# Patient Record
Sex: Male | Born: 1960
Health system: Southern US, Community
[De-identification: ages and names within clinical notes are randomized; demographics above are authoritative.]

## PROBLEM LIST (undated history)

## (undated) DIAGNOSIS — K5792 Diverticulitis of intestine, part unspecified, without perforation or abscess without bleeding: Secondary | ICD-10-CM

## (undated) DIAGNOSIS — T8859XA Other complications of anesthesia, initial encounter: Secondary | ICD-10-CM

## (undated) DIAGNOSIS — R0600 Dyspnea, unspecified: Secondary | ICD-10-CM

## (undated) DIAGNOSIS — F319 Bipolar disorder, unspecified: Secondary | ICD-10-CM

## (undated) DIAGNOSIS — F329 Major depressive disorder, single episode, unspecified: Secondary | ICD-10-CM

## (undated) DIAGNOSIS — I10 Essential (primary) hypertension: Secondary | ICD-10-CM

## (undated) DIAGNOSIS — T4145XA Adverse effect of unspecified anesthetic, initial encounter: Secondary | ICD-10-CM

## (undated) DIAGNOSIS — F419 Anxiety disorder, unspecified: Secondary | ICD-10-CM

## (undated) DIAGNOSIS — M1990 Unspecified osteoarthritis, unspecified site: Secondary | ICD-10-CM

## (undated) DIAGNOSIS — F32A Depression, unspecified: Secondary | ICD-10-CM

## (undated) HISTORY — DX: Depression, unspecified: F32.A

## (undated) HISTORY — PX: LARYNGOSCOPY: SUR817

## (undated) HISTORY — PX: TONSILLECTOMY: SUR1361

---
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
## 2017-12-03 ENCOUNTER — Encounter: Payer: Self-pay | Admitting: Family Medicine

## 2017-12-03 ENCOUNTER — Ambulatory Visit (INDEPENDENT_AMBULATORY_CARE_PROVIDER_SITE_OTHER): Payer: Medicaid Other | Admitting: Family Medicine

## 2017-12-03 VITALS — BP 149/92 | HR 77 | Temp 97.5°F | Resp 17 | Ht 71.0 in | Wt 292.6 lb

## 2017-12-03 DIAGNOSIS — K047 Periapical abscess without sinus: Secondary | ICD-10-CM | POA: Diagnosis not present

## 2017-12-03 DIAGNOSIS — F419 Anxiety disorder, unspecified: Secondary | ICD-10-CM | POA: Diagnosis not present

## 2017-12-03 MED ORDER — AMOXICILLIN-POT CLAVULANATE 875-125 MG PO TABS: 1.0000 | ORAL_TABLET | Freq: Two times a day (BID) | ORAL | 0 refills | Status: DC

## 2017-12-03 MED FILL — AMOX-CLAV 875-125 MG TABLET: 875-125 | 10 days supply | Qty: 20 | Fill #0

## 2017-12-03 NOTE — Progress Notes (Addendum)
Nathaniel Grant, is a 57 y.o. male  ZOX:[id number]  [id number]:[id number]  DOB - Nov 09, 1960  CC:  Chief Complaint  Patient presents with  . Establish Care  . Dental Problem    states that he was seen & prescribed the Clindamycin. states that he is having bad reactions to the medication       HPI: Nathaniel Grant is a 57 y.o. male is here today to establish care.   Nathaniel Grant   Today's visit:  Nathaniel Grant recently relocated to [facility] from [facility]. While in [place] he was recently treated with Amoxicillin for an abscess he developed on his gum involving left lower molar. The infection improved although never resolved, he was recently prescribed Clindamycin which he is having difficulty tolerating due to GI side effects. He is requesting another antibiotic as he making efforts to see a dentist within the coming days to have his tooth extracted.  Patient denies new headaches, chest pain, abdominal pain, nausea, new weakness , numbness or tingling, SOB, edema, or worrisome cough.   Current medications: Current Outpatient Medications:  .  clindamycin (CLEOCIN) 300 MG capsule, Take 300 mg by mouth 4 (four) times daily., Disp: , Rfl:  .  methocarbamol (ROBAXIN) 500 MG tablet, Take 500 mg by mouth 2 (two) times daily as needed for muscle spasms., Disp: , Rfl:  .  naproxen (NAPROSYN) 500 MG tablet, Take 500 mg by mouth 2 (two) times daily with a meal., Disp: , Rfl:    Pertinent family medical history: family history includes Atrial fibrillation in his father; Bladder Cancer in his father; Lung cancer in his mother.   Allergies  Allergen Reactions  . Tetanus Toxoids Swelling    Social History   Socioeconomic History  . Marital status: Single    Spouse name: Not on file  . Number of children: Not on file  . Years of education: Not on file  . Highest education level: Not on file  Occupational History  . Not on file  Social Needs  . Financial resource strain: Not on file  . Food insecurity:     Worry: Not on file    Inability: Not on file  . Transportation needs:    Medical: Not on file    Non-medical: Not on file  Tobacco Use  . Smoking status: Never Smoker  . Smokeless tobacco: Never Used  Substance and Sexual Activity  . Alcohol use: Not Currently  . Drug use: Never  . Sexual activity: Not on file  Lifestyle  . Physical activity:    Days per week: Not on file    Minutes per session: Not on file  . Stress: Not on file  Relationships  . Social connections:    Talks on phone: Not on file    Gets together: Not on file    Attends religious service: Not on file    Active member of club or organization: Not on file    Attends meetings of clubs or organizations: Not on file    Relationship status: Not on file  . Intimate partner violence:    Fear of current or ex partner: Not on file    Emotionally abused: Not on file    Physically abused: Not on file    Forced sexual activity: Not on file  Other Topics Concern  . Not on file  Social History Narrative  . Not on file    Review of Systems: Constitutional: Negative for fever, chills, diaphoresis, activity change, appetite change and fatigue.  HENT:postive for tooth pain  Eyes: Negative for pain, discharge, redness, itching and visual disturbance. Respiratory: Negative for cough, choking, chest tightness, shortness of breath, wheezing and stridor.  Cardiovascular: Negative for chest pain, palpitations and leg swelling. Gastrointestinal: Negative for abdominal distention. Genitourinary: Negative for dysuria, urgency, frequency, hematuria, flank pain, decreased urine volume, difficulty urinating. Musculoskeletal: Negative for back pain, joint swelling, arthralgia and gait problem. Neurological: Negative for dizziness, tremors, seizures, syncope, facial asymmetry, speech difficulty, weakness, light-headedness, numbness and headaches.  Hematological: Negative for adenopathy. Does not bruise/bleed  easily. Psychiatric/Behavioral: Hx anxiety which he feels has worsened with panic attacks.  Objective:   Vitals:   12/03/17 1126  BP: (!) 149/92  Pulse: 77  Resp: 17  Temp: (!) 97.5 F (36.4 C)  SpO2: 97%    BP Readings from Last 3 Encounters:  12/03/17 (!) 149/92    Filed Weights   12/03/17 1126  Weight: 292 lb 9.6 oz (132.7 kg)      Physical Exam: Constitutional: Patient appears well-developed and well-nourished. No distress. HENT: Normocephalic, atraumatic, External right and left ear normal. Oropharynx is clear and moist.   Eyes: Conjunctivae and EOM are normal. PERRLA, no scleral icterus. Neck: Normal ROM. Neck supple. Left sided cervical adenopathy noted CVS: RRR, S1/S2 +, no murmurs, no gallops, no carotid bruit.  Pulmonary: Effort and breath sounds normal, no stridor, rhonchi, wheezes, rales.  Abdominal: Soft. BS +, no distension, tenderness, rebound or guarding.  Musculoskeletal: Normal range of motion. No edema and no tenderness.  Neuro: Alert. Normal muscle tone coordination. Normal gait. BUE and BLE strength 5/5.  Skin: Skin is warm and dry. No rash noted. Not diaphoretic. No erythema. No pallor. Psychiatric: Behavior, judgment, thought content normal. Denies suicidal or homicidal ideations.   Assessment and plan:  1. Infection of tooth, discontinue clindamycin. Start Augmentin 1 tablet twice daily x 10 days. Resources given to patient for dental providers accepting patients without insurance.  2. Anxiety No prior work-up for an anxiety disorder . Patient is scheduling a follow-up to address anxiety and is being referred to internal counselor.    Meds ordered this encounter  Medications  . amoxicillin-clavulanate (AUGMENTIN) 875-125 MG tablet    Sig: Take 1 tablet by mouth 2 (two) times daily.    Dispense:  20 tablet    Refill:  0    Return in about 2 weeks (around 12/17/2017) for BP and Anxiety.   A total of 30  minutes spent, greater than 50 % of  this time was spent counseling and coordination of care.   The patient was given clear instructions to go to ER or return to medical center if symptoms don't improve, worsen or new problems develop. The patient verbalized understanding. The patient was advised  to call and obtain lab results if they haven't heard anything from out office within 7-10 business days.  Joaquin Courts, FNP Primary Care at [facility] [address], [place] [address] [phone]: [phone]    This note has been created with Dragon speech recognition software and Paediatric nurse. Any transcriptional errors are unintentional.

## 2017-12-03 NOTE — Patient Instructions (Addendum)
  I have sent medication to: [facility]  [address] [place], [place] [address] [phone]  I will refer you to [facility] our counselor and I will see you back in 2 weeks for address anxiety and recheck you blood pressure.      Dental Abscess A dental abscess is pus in or around a tooth. Follow these instructions at home:  Take medicines only as told by your dentist.  If you were prescribed antibiotic medicine, finish all of it even if you start to feel better.  Rinse your mouth (gargle) often with salt water.  Do not drive or use heavy machinery, like a lawn mower, while taking pain medicine.  Do not apply heat to the outside of your mouth.  Keep all follow-up visits as told by your dentist. This is important. Contact a doctor if:  Your pain is worse, and medicine does not help. Get help right away if:  You have a fever or chills.  Your symptoms suddenly get worse.  You have a very bad headache.  You have problems breathing or swallowing.  You have trouble opening your mouth.  You have puffiness (swelling) in your neck or around your eye. This information is not intended to replace advice given to you by your health care provider. Make sure you discuss any questions you have with your health care provider. Document Released: 06/08/2014 Document Revised: 06/30/2015 Document Reviewed: 01/19/2014 Elsevier Interactive Patient Education  2018 [employer].    Thank you for choosing Primary Care at [facility] for your medical home!    Nathaniel Grant was seen by Joaquin Courts, FNP today.   [employer] primary care is Bing Neighbors, FNP.   For the best care possible,  you should try to see Joaquin Courts, FNP  whenever you come to clinic.   We look forward to seeing you again soon!  If you have any questions about your visit today,  please call us at   Or feel free to reach your provider via MyChart.

## 2017-12-20 ENCOUNTER — Ambulatory Visit (INDEPENDENT_AMBULATORY_CARE_PROVIDER_SITE_OTHER): Payer: Medicaid Other | Admitting: Family Medicine

## 2017-12-20 ENCOUNTER — Encounter: Payer: Self-pay | Admitting: Family Medicine

## 2017-12-20 ENCOUNTER — Ambulatory Visit: Payer: Self-pay | Attending: Family Medicine | Admitting: Licensed Clinical Social Worker

## 2017-12-20 VITALS — BP 164/103 | HR 79 | Temp 98.5°F | Resp 17 | Ht 71.0 in

## 2017-12-20 DIAGNOSIS — F331 Major depressive disorder, recurrent, moderate: Secondary | ICD-10-CM | POA: Diagnosis not present

## 2017-12-20 DIAGNOSIS — I10 Essential (primary) hypertension: Secondary | ICD-10-CM

## 2017-12-20 DIAGNOSIS — F411 Generalized anxiety disorder: Secondary | ICD-10-CM

## 2017-12-20 DIAGNOSIS — M5441 Lumbago with sciatica, right side: Secondary | ICD-10-CM

## 2017-12-20 DIAGNOSIS — J3489 Other specified disorders of nose and nasal sinuses: Secondary | ICD-10-CM | POA: Diagnosis not present

## 2017-12-20 DIAGNOSIS — G8929 Other chronic pain: Secondary | ICD-10-CM

## 2017-12-20 DIAGNOSIS — F419 Anxiety disorder, unspecified: Secondary | ICD-10-CM

## 2017-12-20 DIAGNOSIS — M5440 Lumbago with sciatica, unspecified side: Secondary | ICD-10-CM

## 2017-12-20 MED ORDER — PROPRANOLOL HCL 10 MG PO TABS: 10.0000 mg | ORAL_TABLET | Freq: Three times a day (TID) | ORAL | 1 refills | Status: DC

## 2017-12-20 MED ORDER — CYCLOBENZAPRINE HCL 10 MG PO TABS: 10.0000 mg | ORAL_TABLET | Freq: Three times a day (TID) | ORAL | 1 refills | Status: DC | PRN

## 2017-12-20 NOTE — Progress Notes (Signed)
Patient ID: Nathaniel Grant, male    DOB: Aug 18, 1960, 57 y.o.   MRN: [id number]  PCP: Bing NeighborsHarris, Ijeoma Loor S, FNP  Chief Complaint  Patient presents with  . Hypertension  . Anxiety    Subjective:  HPI Nathaniel Grant is a 57 y.o. male presents for evaluation of hypertension and medication management for anxiety/depression.  Patient completed encounter with social worker prior to office today. His mood at present is very liable and he taking in a rapid, pressure manner which is impacting ability to effective evaluate and manage patient visit. He initially has requested during his initial visit to be placed on a medication for anxiety. Today, he is declining any medications to managed mental health conditions. He becomes increasing angry when requests Soma for chronic pain and is told that this is not a medication that is prescribed by this practice. He shifts from subject to subjects at one point interjects that at one point he was treated for Bipolar disorder with Depakote, but the diagnosis was incorrect and he stopped medication independently.    Blood pressure Elevated  Family significant for hypertension parents, siblings from hypertension and her managed with medication.  His blood pressure was significantly elevated x2 readings at initial visit and remains significantly elevated today.  He becomes very belligerent when discussing the possibility of needing blood pressure medications making the comment that "just another pills at me". Blood pressure times two readings are greater than 150/90.  He denies chest pain, headache, although notes head pressure which he relates to chronic sinusitis. Denies dizziness or shortness of breath.   Medication   Social History   Socioeconomic History  . Marital status: Single    Spouse name: Not on file  . Number of children: Not on file  . Years of education: Not on file  . Highest education level: Not on file  Occupational History  . Not on file   Social Needs  . Financial resource strain: Not on file  . Food insecurity:    Worry: Not on file    Inability: Not on file  . Transportation needs:    Medical: Not on file    Non-medical: Not on file  Tobacco Use  . Smoking status: Never Smoker  . Smokeless tobacco: Never Used  Substance and Sexual Activity  . Alcohol use: Not Currently  . Drug use: Never  . Sexual activity: Not on file  Lifestyle  . Physical activity:    Days per week: Not on file    Minutes per session: Not on file  . Stress: Not on file  Relationships  . Social connections:    Talks on phone: Not on file    Gets together: Not on file    Attends religious service: Not on file    Active member of club or organization: Not on file    Attends meetings of clubs or organizations: Not on file    Relationship status: Not on file  . Intimate partner violence:    Fear of current or ex partner: Not on file    Emotionally abused: Not on file    Physically abused: Not on file    Forced sexual activity: Not on file  Other Topics Concern  . Not on file  Social History Narrative  . Not on file    Family History  Problem Relation Age of Onset  . Lung cancer Mother   . Bladder Cancer Father   . Atrial fibrillation Father      Review  of Systems Denies chest pain, headaches, new weakness, worsening cough, abdominal pain, edema, urinary retention, urinary frequency, wheezing,chest tightness,depression, suicidal ideations or auditory hallucinations. Allergies  Allergen Reactions  . Clindamycin/Lincomycin     Bad taste Headache  Gi diarrhea   . Tetanus Toxoids Swelling    Prior to Admission medications   Medication Sig Start Date End Date Taking? Authorizing Provider  methocarbamol (ROBAXIN) 500 MG tablet Take 500 mg by mouth 2 (two) times daily as needed for muscle spasms.   Yes [provider]  naproxen (NAPROSYN) 500 MG tablet Take 500 mg by mouth 2 (two) times daily with a meal.   Yes [provider]    Past Medical, Surgical Family and Social History reviewed and updated.    Objective:   Today's Vitals   12/20/17 1039  BP: (!) 164/103  Pulse: 79  Resp: 17  Temp: 98.5 F (36.9 C)  TempSrc: Oral  SpO2: 95%  Height: 5\' 11"  (1.803 m)    Wt Readings from Last 3 Encounters:  12/03/17 292 lb 9.6 oz (132.7 kg)     Physical Exam General appearance: alert, well developed, well nourished, cooperative and in no distress Head: Normocephalic, without obvious abnormality, atraumatic Respiratory: Respirations even and unlabored, normal respiratory rate Heart: rate and rhythm normal. No gallop or murmurs noted on exam  Extremities: No gross deformities Skin: Skin color, texture, turgor normal. No rashes seen  Psych: Appropriate mood and affect. Neurologic: Mental status: Alert, oriented to person, place, and time, thought content appropriate.  Assessment & Plan:  1. Essential hypertension, new Patient initially refused treatment.  He was advised that if he continues to refuse to manage his accelerated hypertension will no longer be managed by me as his PCP.  We came to the conclusion that he agreed to start propanolol 10 mg which could hopefully help decrease his anxiety as well as manage his blood pressure.  Asymptomatic today.  Given his level of anxiety his blood pressure may be in excess elevation to his current level of temperament.  2. Sinus pressure Recommend trialing saline spray and and as needed cetirizine or any other over-the-counter antihistamine.  3. GAD (generalized anxiety disorder) 4. Moderate episode of recurrent major depressive disorder [facility]) Patient displays symptoms today which are consistent with bipolar disorder.  Given his admitted history and prior diagnoses of bipolar disorder likely warrant a follow-up with a licensed psychologist.  I have referred him back to the social worker after a visit today to receive information regarding resources  within the community where he can continue his current mental health evaluation and management. Started on propanolol 10 mg three times daily PRN for anxiety/ hypertension.  5. Chronic bilateral low back pain with sciatica, sciatica laterality unspecified -Continue Robaxin.  Start cyclobenzaprine 10 mg up to 3 times daily as needed for back pain.  Continue naproxen.  Likely warrant a referral once his financial assistance is completed to orthopedic.  Meds ordered this encounter  Medications  . cyclobenzaprine (FLEXERIL) 10 MG tablet    Sig: Take 1 tablet (10 mg total) by mouth 3 (three) times daily as needed for muscle spasms.    Dispense:  60 tablet    Refill:  1  . propranolol (INDERAL) 10 MG tablet    Sig: Take 1 tablet (10 mg total) by mouth 3 (three) times daily.    Dispense:  90 tablet    Refill:  1      -The patient was given clear instructions  to go to ER or return to medical center if symptoms do not improve, worsen or new problems develop. The patient verbalized understanding.    Joaquin Courts, FNP Primary Care at [facility] [address] [place] [address] [phone]: [phone]

## 2017-12-20 NOTE — BH Specialist Note (Signed)
Integrated Behavioral Health Initial Visit  MRN: [id number] Name: Nathaniel Grant  Number of Integrated Behavioral Health Clinician visits:: 1/6 Session Start time: 9:05 AM  Session End time: 10:05 AM Total time: 1 hour  Type of Service: Integrated Behavioral Health- Individual/Family Interpretor:No. Interpretor Name and Language: N/A   Warm Hand Off Completed.       SUBJECTIVE: Nathaniel Grant is a 57 y.o. male accompanied by Sibling Patient was referred by FNP Tiburcio PeaHarris for depression and anxiety. Patient reports the following symptoms/concerns: [place] to [place] strain and impending homelessness. He is currently residing with his sister. Pt reports difficulty managing ongoing medical conditions, chronic pain, and behavioral health Duration of problem: Ongoing; Severity of problem: moderate  OBJECTIVE: Mood: Anxious, Depressed and Irritable and Affect: Appropriate Risk of harm to self or others: No plan to harm self or others  LIFE CONTEXT: Family and Social: Pt resides with his adult sibling. He has another sister that resides in [place], both parents are deceased. Pt's fiance passed away School/Work: Pt is currently unemployed. Self-Care: Pt has hx of medication management reporting that he used to take wellbutrin and xanax. He cares for his two cats and enjoys being creative (writing and playing music) to cope with stressors Life Changes: Pt reports difficulty managing ongoing medical conditions, chronic pain, and behavioral health.   GOALS ADDRESSED: Patient will: 1. Reduce symptoms of: agitation, anxiety and depression 2. Increase knowledge and/or ability of: coping skills and healthy habits  3. Demonstrate ability to: Increase healthy adjustment to current life circumstances, Increase adequate support systems for patient/family and Increase motivation to adhere to plan of care  INTERVENTIONS: Interventions utilized: Mindfulness or  Management consultantelaxation Training, Supportive Counseling, Psychoeducation and/or Health Education and Link to [facility] Resources  Standardized Assessments completed: GAD-7 and PHQ 2&9  ASSESSMENT: Patient currently experiencing depression and anxiety triggered by difficulty managing ongoing medical conditions, chronic pain, and financial strain. [place] due to [place] strain and impending homelessness. He is currently residing with his sister. Denies any current SI/HI.   Patient may benefit from psychoeducation, psychotherapy, and medication management. LCSWA educated pt on the correlation between one's physical and mental health, in addition, to how stress and grief can negatively impact both. Pt had good insight on triggers that have impacted his ability to cope, including overutilizing medication (xanax) LCSWA validated pt's feelings, provided encouragement, and discussed techniques to implement realistic goals and utilize healthy coping skills. Pt was provided information on crisis intervention, psychotherapy, and medication management resources for the uninsured. Pt agreed to contact [facility] and was provided their contact information.   PLAN: 1. Follow up with behavioral health clinician on : Pt was encouraged to contact LCSWA if symptoms worsen or fail to improve to [facility]. 2. Behavioral recommendations: LCSWA recommends that pt apply healthy coping skills discussed. Pt is encouraged to schedule follow up appointment with LCSWA 3. Referral(s): Integrated Art gallery managerBehavioral Health Services (In Clinic), Community Mental Health Services (LME/Outside Clinic) and Community Resources:  Food and Finances 4. "From scale of 1-10, how likely are you to follow plan?":   Nathaniel LarssonJasmine D Trejan Buda, LCSW 12/24/17 2:53 PM

## 2017-12-25 DIAGNOSIS — F411 Generalized anxiety disorder: Secondary | ICD-10-CM | POA: Insufficient documentation

## 2017-12-25 DIAGNOSIS — F419 Anxiety disorder, unspecified: Secondary | ICD-10-CM | POA: Insufficient documentation

## 2018-01-09 ENCOUNTER — Telehealth: Payer: Self-pay | Admitting: Family Medicine

## 2018-01-09 MED ORDER — NAPROXEN 500 MG PO TABS: 500.0000 mg | ORAL_TABLET | Freq: Two times a day (BID) | ORAL | 1 refills | Status: DC

## 2018-01-09 MED FILL — NAPROXEN 500 MG TABLET: 500 | 45 days supply | Qty: 90 | Fill #0

## 2018-01-09 NOTE — Telephone Encounter (Signed)
Patient sister called requesting  naproxen (NAPROSYN) 500 MG tablet [[id number]][[id number]] for patient, [facility] pharmacy please follow up

## 2018-01-09 NOTE — Telephone Encounter (Signed)
Completed request.  

## 2018-01-09 NOTE — Telephone Encounter (Signed)
Please advise 

## 2018-01-31 ENCOUNTER — Ambulatory Visit (INDEPENDENT_AMBULATORY_CARE_PROVIDER_SITE_OTHER): Payer: Medicaid Other

## 2018-01-31 VITALS — BP 142/93 | HR 70

## 2018-01-31 DIAGNOSIS — I10 Essential (primary) hypertension: Secondary | ICD-10-CM

## 2018-01-31 MED ORDER — PROPRANOLOL HCL 20 MG PO TABS: 20.0000 mg | ORAL_TABLET | Freq: Three times a day (TID) | ORAL | 1 refills | Status: DC

## 2018-01-31 NOTE — Progress Notes (Signed)
Reviewed CMA documentation in accordance with the prescribed plan

## 2018-01-31 NOTE — Progress Notes (Signed)
Patient here for BP check. BP was 142/93. Pulse was 70. Notified provider. She states to have patient increase Propranolol to 20 mg TID. Made nurse visit for 03/14/17 for BP check. Otis PeakKWalker, CMA

## 2018-03-14 ENCOUNTER — Ambulatory Visit: Payer: Self-pay

## 2018-04-02 ENCOUNTER — Ambulatory Visit: Payer: Medicaid Other

## 2018-04-02 VITALS — BP 142/93 | HR 85

## 2018-04-02 DIAGNOSIS — I10 Essential (primary) hypertension: Secondary | ICD-10-CM

## 2018-04-02 NOTE — Progress Notes (Signed)
Patient here for BP check. Initial BP after sitting for 15 minutes was 168/124. Pulse 76. Provider wanted to let him sit an additional 15 minutes & recheck. Recheck was 142/93. Pulse 85. Spoke with provider & she states to continue current medication and follow up with her in 2 months. Otis Peak, CMA

## 2018-05-27 ENCOUNTER — Telehealth: Payer: Self-pay

## 2018-05-27 NOTE — Telephone Encounter (Signed)
Called patient to do their pre-visit COVID screening.  Have you recently traveled internationally([place], [place], [place], [place], [place]) or within the [place] to a hotspot area([place], [place], [place], [place], [place])? no  Are you currently experiencing any of the following: fever, cough, SHOB, fatigue? no  Have you been in contact with anyone who has recently travelled? no  Have you been in contact with anyone who is experiencing fever, cough, SHOB, fatigue or been diagnosed with COVID  or works in or has recently visited a SNF? no  

## 2018-05-28 ENCOUNTER — Encounter: Payer: Self-pay | Admitting: Family Medicine

## 2018-05-28 ENCOUNTER — Ambulatory Visit (INDEPENDENT_AMBULATORY_CARE_PROVIDER_SITE_OTHER): Payer: Medicaid Other | Admitting: Family Medicine

## 2018-05-28 ENCOUNTER — Other Ambulatory Visit: Payer: Self-pay

## 2018-05-28 DIAGNOSIS — M5440 Lumbago with sciatica, unspecified side: Secondary | ICD-10-CM

## 2018-05-28 DIAGNOSIS — I10 Essential (primary) hypertension: Secondary | ICD-10-CM

## 2018-05-28 DIAGNOSIS — R600 Localized edema: Secondary | ICD-10-CM | POA: Diagnosis not present

## 2018-05-28 DIAGNOSIS — J392 Other diseases of pharynx: Secondary | ICD-10-CM | POA: Diagnosis not present

## 2018-05-28 DIAGNOSIS — G8929 Other chronic pain: Secondary | ICD-10-CM

## 2018-05-28 MED ORDER — NAPROXEN 500 MG PO TABS: 500.0000 mg | ORAL_TABLET | Freq: Two times a day (BID) | ORAL | 1 refills | Status: AC

## 2018-05-28 MED ORDER — CYCLOBENZAPRINE HCL 10 MG PO TABS: 10.0000 mg | ORAL_TABLET | Freq: Three times a day (TID) | ORAL | 1 refills | Status: DC | PRN

## 2018-05-28 MED ORDER — PROPRANOLOL HCL 20 MG PO TABS: 20.0000 mg | ORAL_TABLET | Freq: Three times a day (TID) | ORAL | 1 refills | Status: AC

## 2018-05-28 NOTE — Progress Notes (Deleted)
Concerned of no medical records from [facility].  Needs to Medical Records  Transferred.

## 2018-06-01 NOTE — Progress Notes (Signed)
Virtual Visit via Telephone Note  I connected with Nathaniel Grant on 06/01/18 at 10:30 AM EDT by telephone and verified that I am speaking with the correct person using two identifiers.   I discussed the limitations, risks, security and privacy concerns of performing an evaluation and management service by telephone and the availability of in person appointments. I also discussed with the patient that there may be a patient responsible charge related to this service. The patient expressed understanding and agreed to proceed.  Provider is located in primary care office during today's encounter.  History of Present Illness: Nathaniel Grant's encounter today is to follow-up regarding hypertension management. Immediately, upon initiation encounter, Nathaniel Grant is verbally agressive and evasive with responding to questions regarding his blood pressure. "I don't know if my blood pressure is good or not". "I don't ever feel fine." "My feet swell daily but resolve later in the day". He endorses sitting in the same spot all day, everyday. Nathaniel Grant also suffers from significant mental health disease and is followed by [facility] for counseling services only as he refuses medication management. He transition the conversation to asking "why don't you have my records". After being told, that our office did not have his records, he again became verbally agitated, and state "how can you treat me". He was again reminded that was only seen previously for dental pain, back pain, and hypertension. During office visit on 12/20/2017, he also demonstrated concerning mental health behaviors and was referred to LCSW, [facility], for further evaluation and management. He then proceeds to tell me that he is transferring his care to [facility], however, requests referrals to orthopedic specialty for chronic back pain and ENT for chronic pharynx narrowing for which he reports receiving treatment by ENT in [facility]. He them refocuses on his  medical records and is demanding that something be done to have these records sent to our office in order for [facility] to be able to access from our [facility] office once he transfers.  Assessment and Plan: 1. Pharyngeal constriction of undetermined etiology - Ambulatory referral to ENT  2. Essential hypertension -No changes to current medication as I am uncertain of BP control -Continue propranolol 20 mg, 3 times daily.  3. Lower extremity edema -Reduce sodium intake -Increase physical activity mobility  -Recommend compression socks   4. Chronic bilateral low back pain with sciatica, sciatica laterality unspecified - Ambulatory referral to Orthopedic Surgery -Continue cyclobenzaprine and naproxen   Follow Up Instructions: -Follow-up with new PCP next month regarding further management of chronic conditions.   I discussed the assessment and treatment plan with the patient. The patient was provided an opportunity to ask questions and all were answered. The patient agreed with the plan and demonstrated an understanding of the instructions.   The patient was advised to call back or seek an in-person evaluation if the symptoms worsen or if the condition fails to improve as anticipated.  I provided 18 minutes of non-face-to-face time during this encounter.   Joaquin Courts, FNP

## 2018-06-09 ENCOUNTER — Telehealth: Payer: Self-pay | Admitting: Family Medicine

## 2018-06-09 NOTE — Telephone Encounter (Signed)
[facility] [phone] Elane Fritz about medical records

## 2018-06-11 ENCOUNTER — Ambulatory Visit (INDEPENDENT_AMBULATORY_CARE_PROVIDER_SITE_OTHER): Payer: Medicaid Other | Admitting: Orthopaedic Surgery

## 2018-06-11 ENCOUNTER — Telehealth: Payer: Self-pay | Admitting: Family Medicine

## 2018-06-11 ENCOUNTER — Other Ambulatory Visit: Payer: Self-pay

## 2018-06-11 ENCOUNTER — Encounter: Payer: Self-pay | Admitting: Orthopaedic Surgery

## 2018-06-11 ENCOUNTER — Ambulatory Visit (INDEPENDENT_AMBULATORY_CARE_PROVIDER_SITE_OTHER): Payer: Medicaid Other

## 2018-06-11 VITALS — Ht 72.0 in | Wt 330.0 lb

## 2018-06-11 DIAGNOSIS — G8929 Other chronic pain: Secondary | ICD-10-CM

## 2018-06-11 DIAGNOSIS — M545 Low back pain: Secondary | ICD-10-CM

## 2018-06-11 DIAGNOSIS — M542 Cervicalgia: Secondary | ICD-10-CM | POA: Diagnosis not present

## 2018-06-11 NOTE — Progress Notes (Signed)
Office Visit Note   Patient: Nathaniel Grant           Date of Birth: 02-Oct-1960           MRN: [id number] Visit Date: 06/11/2018              Requested by: Nathaniel Neighbors, FNP [address] [place], [place] [address] PCP: Nathaniel Neighbors, FNP   Assessment & Plan: Visit Diagnoses:  1. Chronic bilateral low back pain, unspecified whether sciatica present   2. Neck pain   3. Hx of left rotator cuff tear  Plan: We will place patient in physical therapy program for treatment of his back and neck.  Will defer the left rotator cuff tear at this point.  We discussed evaluating the area that seems to bother him the most he can tell us whether he like to proceed with C-spine versus lumbar spine work-up on return if he does not respond to therapy recheck 5 weeks.  Follow-Up Instructions: Return in about 5 weeks (around 07/16/2018).   Orders:  Orders Placed This Encounter  Procedures  . XR Cervical Spine 2 or 3 views  . XR Lumbar Spine 2-3 Views  . Ambulatory referral to Physical Therapy   No orders of the defined types were placed in this encounter.     Procedures: No procedures performed   Clinical Data: No additional findings.   Subjective: Chief Complaint  Patient presents with  . Lower Back - Pain  . Neck - Pain  . Left Shoulder - Pain    HPI 58 year old male with chronic neck pain low back pain.  He previously was treated in [facility] he states at one point they were scheduling him for left rotator cuff repair.  He is left-hand dominant he has problems with numbness in his hands and states he cannot play the guitar for the last 2 years due to numbness in his hands and associated neck pain.  Hands do not wake him up at night he can get his arm up over his head on the left side.  He has taken Naprosyn and Flexeril and states Soma in the past always worked better for him he took it for several years then stopped it on his own.  He had requested more Soma since it  tended to help him.  He is also had problems with chronic back pain and states when he was in his early 85s he was told that he needs surgery on his spine and a fusion was discussed.  He had sciatica on the left side and states he has some problems on the right more on the left.  He states she has some pain in the thoracic spine but neck and low back seem to be worse.  Patient is here with a sister he has history of aggression possible bipolar disorder problems with noncompliance with hypertension medications.  He has been treated by [facility] in the past when he had noncompliant issues with medications.  Today he is conversant cooperative and pleasant and sister is with him.  He denies associated bowel or bladder problems.  Patient remains concerned that all his records from [facility] are not immediately available for all providers here in [place].  Patient apparently had a scan back in August of the shoulder documenting a rotator cuff tear on the left.  Review of Systems positive for obesity lower extremity edema, hypertension with the intermittent noncompliance.  Chronic back pain neck pain, history  of left rotator cuff tear.  Chart review documents a history of verbal aggressive behavior at times.  Positive for anxiety.   Objective: Vital Signs: Ht 6' (1.829 m)   Wt (!) 330 lb (149.7 kg)   BMI 44.76 kg/m   Physical Exam Constitutional:      Appearance: He is well-developed.  HENT:     Head: Normocephalic and atraumatic.  Eyes:     Pupils: Pupils are equal, round, and reactive to light.  Neck:     Thyroid: No thyromegaly.     Trachea: No tracheal deviation.  Cardiovascular:     Rate and Rhythm: Normal rate.  Pulmonary:     Effort: Pulmonary effort is normal.     Breath sounds: No wheezing.  Abdominal:     General: Bowel sounds are normal.     Palpations: Abdomen is soft.  Skin:    General: Skin is warm and dry.     Capillary Refill: Capillary refill takes less than 2 seconds.   Neurological:     Mental Status: He is alert and oriented to person, place, and time.  Psychiatric:        Behavior: Behavior normal.        Thought Content: Thought content normal.        Judgment: Judgment normal.     Ortho Exam patient has bilateral pitting edema both the lower extremities without venous stasis ulceration.  No plantar foot lesions.  Anterior tib EHL is intact bilaterally he has some pain with attempted left hip flexion but with encouragement is able to demonstrate good resistance no hip adduction weakness.  Knee and ankle jerk are 1+ and symmetrical upper extremities biceps triceps, brachioradialis reflexes are 1+ and symmetrical.  Abduction left shoulder only to 90 degrees right arm and get overhead easily flexion to 90.  Mild tenderness both brachial plexus tenderness some tenderness of the trapezius tenderness over the spinous process.  No tenderness of the lateral epicondyles no thenar or hyperthenar atrophy ulnar nerve at the elbow is stable.  Specialty Comments:  No specialty comments available.  Imaging: Xr Cervical Spine 2 Or 3 Views  Result Date: 06/11/2018 AP lateral cervical spine x-rays are obtained and reviewed.  This shows pronounced C5-6 C6-7 facet arthropathy on the right versus left with spurring and disc space narrowing.  Some reversal of normal curvature and some disc space narrowing. Impression: Mid cervical spondylosis.  No spondylolisthesis.  Xr Lumbar Spine 2-3 Views  Result Date: 06/11/2018 AP lateral lumbar spine x-rays are obtained and reviewed.  This shows endplate spurring right and left at L2-3 with some narrowing.  No spondylolisthesis.  Narrowing and anterior spurring at L5-S1.  Sacroiliac joints and portion of hip joint visualized are normal.  Negative for acute fracture. Impression: Lumbar spondylosis noted primarily at L2-3 and also L5-S1.    PMFS History: Patient Active Problem List   Diagnosis Date Noted  . Anxiety 12/25/2017    History reviewed. No pertinent past medical history.  Family History  Problem Relation Age of Onset  . Lung cancer Mother   . Bladder Cancer Father   . Atrial fibrillation Father     History reviewed. No pertinent surgical history. Social History   Occupational History  . Not on file  Tobacco Use  . Smoking status: Never Smoker  . Smokeless tobacco: Never Used  Substance and Sexual Activity  . Alcohol use: Not Currently  . Drug use: Never  . Sexual activity: Not on file

## 2018-06-11 NOTE — Telephone Encounter (Signed)
[facility] told him that we haven't reached out to get medical records from there office  The fax number  [phone] The main office is [phone] or [phone]

## 2018-06-12 DIAGNOSIS — Z789 Other specified health status: Secondary | ICD-10-CM | POA: Insufficient documentation

## 2018-06-18 ENCOUNTER — Ambulatory Visit: Payer: Medicaid Other | Attending: Orthopaedic Surgery

## 2018-06-18 ENCOUNTER — Other Ambulatory Visit: Payer: Self-pay

## 2018-06-18 DIAGNOSIS — M6281 Muscle weakness (generalized): Secondary | ICD-10-CM

## 2018-06-18 DIAGNOSIS — M542 Cervicalgia: Secondary | ICD-10-CM | POA: Insufficient documentation

## 2018-06-18 DIAGNOSIS — M545 Low back pain: Secondary | ICD-10-CM | POA: Insufficient documentation

## 2018-06-18 DIAGNOSIS — G8929 Other chronic pain: Secondary | ICD-10-CM

## 2018-06-18 NOTE — Therapy (Signed)
[facility] [address] [place], [place], [address] Phone: [phone]   Fax:  [phone]  Physical Therapy Evaluation  Patient Details  Name: [facility]iel [facility] MRN: [id number] Date of Birth: [birth year] Referring Provider (PT): Annell Greening, MD   Encounter Date: 06/18/2018  PT End of Session - 06/18/18 0953    Visit Number  1    Date for PT Re-Evaluation  08/13/18    Authorization Type  Medicaid- requested 06/18/18    PT Start Time  0900    PT Stop Time  0943    PT Time Calculation (min)  43 min    Activity Tolerance  Patient tolerated treatment well    Behavior During Therapy  [facility] for tasks assessed/performed       Past Medical History:  Diagnosis Date  . Depression     History reviewed. No pertinent surgical history.  There were no vitals filed for this visit.   Subjective Assessment - 06/18/18 0903    Subjective  Pt presents to PT with chronic history of neck and lumbar pain.  Pt had x-ray: C5-6 and C6-7 facet arthropahty and spurring.  Lumbar spondylosis: L2-3, L5-S1.      Pertinent History  chronic cervical and lumbar pain, diverticulitis, depression    Limitations  Standing;Sitting;Walking    How long can you sit comfortably?  30 minutes max    How long can you stand comfortably?  very limited < 5 minutes    How long can you walk comfortably?  walking limited in the community- 10 minutes limitation    Diagnostic tests  Pt had x-ray: C5-6 and C6-7 facet arthropahty and spurring.  Lumbar spondylosis: L2-3, L5-S1.    Patient Stated Goals  reduce pain, sit/stand/walk longer    Currently in Pain?  Yes    Pain Score  5    varies up to 9/10   Pain Location  Back    Pain Orientation  Right;Left    Pain Descriptors / Indicators  Aching;Tightness;Numbness    Pain Type  Chronic pain    Pain Onset  More than a month ago    Pain Frequency  Constant    Aggravating Factors   standing to cook, wash dishes, walking  in the community    Pain Relieving Factors  Naproxen    Multiple Pain Sites  Yes    Pain Score  5    Pain Location  Neck    Pain Orientation  Right;Left    Pain Descriptors / Indicators  Numbness;Aching;Tightness    Pain Type  Chronic pain    Pain Onset  More than a month ago    Pain Frequency  Constant    Aggravating Factors   sitting at the computer    Pain Relieving Factors  change of position, nothing         [facility] PT Assessment - 06/18/18 0001      Assessment   Medical Diagnosis  chronic bilateral low back pain, neck pain    Referring Provider (PT)  Annell Greening, MD    Onset Date/Surgical Date  --   chronic  (20+ years)   Hand Dominance  Left    Next MD Visit  unknown      Precautions   Precautions  None      Restrictions   Weight Bearing Restrictions  No      Balance Screen   Has the patient fallen in the past 6 months  No  Has the patient had a decrease in activity level because of a fear of falling?   No    Is the patient reluctant to leave their home because of a fear of falling?   No      Home Public house manager residence    Living Arrangements  Other relatives      Prior Function   Level of Independence  Independent    Vocation  Unemployed    Leisure  computer- Social worker, plays guitar      Cognition   Overall Cognitive Status  Within Functional Limits for tasks assessed      Observation/Other Assessments   Focus on Therapeutic Outcomes (FOTO)   72% limitation      Posture/Postural Control   Posture/Postural Control  Postural limitations    Postural Limitations  Forward head;Flexed trunk;Increased thoracic kyphosis      ROM / Strength   AROM / PROM / Strength  AROM;PROM;Strength      AROM   Overall AROM   Deficits    Overall AROM Comments  cervical A/ROM is full except rotation limited by 25% bilaterally.  All A/ROM with pain.  Lumbar A/ROM is limited by 25% in all directions due to pain.  Lt shoulder limited by >50% due  to pain      Strength   Overall Strength  Deficits    Overall Strength Comments  Lt UE 4-/5, Rt UE 4+/5, LEs: knees 4+/5, hips 4-/5      Palpation   Palpation comment  pt with trigger points in bil upper traps, cervical paraspinals, thoracic paraspinals and gluteals bilaterally      Transfers   Transfers  Stand to Sit;Sit to Stand    Sit to Stand  6: Modified independent (Device/Increase time)    Stand to Sit  With upper extremity assist      Ambulation/Gait   Ambulation/Gait  Yes    Ambulation/Gait Assistance  6: Modified independent (Device/Increase time)    Gait Pattern  Step-through pattern;Decreased stride length                Objective measurements completed on examination: See above findings.                PT Short Term Goals - 06/18/18 0958      PT SHORT TERM GOAL #1   Title  be independent in initial HEP    Time  4    Period  Weeks    Status  New    Target Date  07/16/18      PT SHORT TERM GOAL #2   Title  initiate a walking program to improve endurance for community function    Time  4    Period  Weeks    Status  New    Target Date  07/16/18      PT SHORT TERM GOAL #3   Title  verbalize understanding of pain neuroscience education and initiate 1 strategy    Time  4    Period  Weeks    Status  New    Target Date  07/16/18        PT Long Term Goals - 06/18/18 0959      PT LONG TERM GOAL #1   Title  be independent in advanced HEP    Time  8    Period  Weeks    Status  New    Target Date  08/13/18  PT LONG TERM GOAL #2   Title  reduce FOTO to < or = to 59% limitation    Time  8    Period  Weeks    Status  New    Target Date  08/13/18      PT LONG TERM GOAL #3   Title  perform regular walking for exercise > or = to 5 days a week    Time  8    Period  Weeks    Status  New    Target Date  08/13/18      PT LONG TERM GOAL #4   Title  report a 30% reduction in neck and lumbar pain with sitting and walking    Time  8     Period  Weeks    Status  New    Target Date  08/13/18      PT LONG TERM GOAL #5   Title  demonstrate Rt and Lt cervical A/ROM rotation to > or = to 75 degrees to improve safety with driving    Time  8    Period  Weeks    Status  New    Target Date  08/13/18             Plan - 06/18/18 1058    Clinical Impression Statement  Pt presents to PT with >20 year history of neck and lumbar pain.  Pt reports constant pain and rates 5-8/10 on average.  Pt is significantly immobile and walks < 10 minutes daily.  Pt is limited to standing < 10 minutes and sitting < 30 minutes due to pain.  Pt demonstrates painful cervical A/ROM and rotation is limited by 25% bilaterally (60 degrees). Lumbar A/ROM is limited by 25%.  Pt with diffuse palpable tenderness over bil upper traps with trigger points, cervical paraspinals, thoracic paraspinals, lumbar paraspinals and bil gluteals.  Pt will benefit from skilled PT to address immobility, chronic pain, trigger points and reduced flexibility.     Personal Factors and Comorbidities  Comorbidity 2    Comorbidities  obesity, chronic low back and cervical pain    Examination-Activity Limitations  Dressing;Sit;Stand;Stairs;Hygiene/Grooming;Bathing;Squat    Examination-Participation Restrictions  Community Activity;Meal Prep;Driving    Stability/Clinical Decision Making  Evolving/Moderate complexity    Clinical Decision Making  Moderate    Rehab Potential  Good    PT Frequency  2x / week    PT Duration  8 weeks    PT Treatment/Interventions  ADLs/Self Care Home Management;Cryotherapy;Electrical Stimulation;Moist Heat;Therapeutic exercise;Therapeutic activities;Functional mobility training;Neuromuscular re-education;Patient/family education;Manual techniques;Passive range of motion;Dry needling;Taping;Spinal Manipulations    PT Next Visit Plan  gentle flexibility for spine, discuss pain neuroscience education, NuStep, dry needling to neck    Consulted and Agree  with Plan of Care  Patient       Patient will benefit from skilled therapeutic intervention in order to improve the following deficits and impairments:  Pain, Improper body mechanics, Postural dysfunction, Increased muscle spasms, Decreased activity tolerance, Decreased endurance, Decreased range of motion, Decreased strength, Impaired flexibility  Visit Diagnosis: Cervicalgia - Plan: PT plan of care cert/re-cert  Chronic bilateral low back pain, unspecified whether sciatica present - Plan: PT plan of care cert/re-cert  Muscle weakness (generalized) - Plan: PT plan of care cert/re-cert     Problem List Patient Active Problem List   Diagnosis Date Noted  . Anxiety 12/25/2017    Lorrene ReidKelly Shriya Aker, PT 06/18/18 11:12 AM  [facility] [address] [place], [place], [address] Phone: [phone]   Fax:  [phone]  Name: Nathaniel [facility] MRN: [id number] Date of Birth: 1960/09/06

## 2018-07-22 ENCOUNTER — Telehealth: Payer: Self-pay | Admitting: Orthopaedic Surgery

## 2018-07-22 ENCOUNTER — Telehealth: Payer: Self-pay

## 2018-07-22 NOTE — Telephone Encounter (Signed)
Called patient left message to return call to reschedule his appointment per patient request

## 2018-07-22 NOTE — Telephone Encounter (Signed)
Per patient request, I email him on 07/22/2018 @1008 . He states he does not get his voicemail and he prefers email.

## 2018-07-25 ENCOUNTER — Ambulatory Visit: Payer: Medicaid Other | Admitting: Orthopaedic Surgery

## 2018-07-30 ENCOUNTER — Other Ambulatory Visit: Payer: Self-pay

## 2018-07-30 ENCOUNTER — Encounter: Payer: Self-pay | Admitting: Physical Therapy

## 2018-07-30 ENCOUNTER — Ambulatory Visit: Payer: Medicaid Other | Attending: Orthopaedic Surgery | Admitting: Physical Therapy

## 2018-07-30 DIAGNOSIS — M6281 Muscle weakness (generalized): Secondary | ICD-10-CM | POA: Diagnosis present

## 2018-07-30 DIAGNOSIS — M542 Cervicalgia: Secondary | ICD-10-CM | POA: Insufficient documentation

## 2018-07-30 DIAGNOSIS — M545 Low back pain: Secondary | ICD-10-CM | POA: Diagnosis present

## 2018-07-30 DIAGNOSIS — G8929 Other chronic pain: Secondary | ICD-10-CM | POA: Diagnosis present

## 2018-07-30 NOTE — Therapy (Signed)
[facility] [address] [place], [place], [address] Phone: [phone]   Fax:  [phone]  Physical Therapy Treatment  Patient Details  Name: Nathaniel Grant MRN: [id number] Date of Birth: May 29, 1960 Referring Provider (PT): Rodell Perna, MD   Encounter Date: 07/30/2018  PT End of Session - 07/30/18 1443    Visit Number  2    Date for PT Re-Evaluation  08/13/18    Authorization Type  Medicaid    Authorization Time Period  6/12-7/9    Authorization - Visit Number  2    Authorization - Number of Visits  4    PT Start Time  1400    PT Stop Time  1440    PT Time Calculation (min)  40 min    Activity Tolerance  Patient tolerated treatment well;No increased pain    Behavior During Therapy  WFL for tasks assessed/performed       Past Medical History:  Diagnosis Date  . Depression     History reviewed. No pertinent surgical history.  There were no vitals filed for this visit.  Subjective Assessment - 07/30/18 1359    Subjective  Some days are better than others. I feel stiff today. I drink to help me with my pain.    Pertinent History  chronic cervical and lumbar pain, diverticulitis, depression    Limitations  Standing;Sitting;Walking    How long can you sit comfortably?  30 minutes max    How long can you stand comfortably?  very limited < 5 minutes    How long can you walk comfortably?  walking limited in the community- 10 minutes limitation    Diagnostic tests  Pt had x-ray: C5-6 and C6-7 facet arthropahty and spurring.  Lumbar spondylosis: L2-3, L5-S1.    Patient Stated Goals  reduce pain, sit/stand/walk longer    Currently in Pain?  Yes    Pain Score  7     Pain Location  Back    Pain Orientation  Right;Left    Pain Descriptors / Indicators  Aching;Tightness;Numbness    Pain Type  Chronic pain    Pain Onset  More than a month ago    Pain Frequency  Constant    Aggravating Factors   standing to cook,  wash dishes, walking in the community    Pain Relieving Factors  Naproxen    Multiple Pain Sites  Yes    Pain Score  5    Pain Location  Neck    Pain Orientation  Right;Left    Pain Descriptors / Indicators  Numbness;Aching;Tightness    Pain Type  Chronic pain    Pain Onset  More than a month ago    Pain Frequency  Constant    Aggravating Factors   sitting at the computer    Pain Relieving Factors  change of position, nothing                       [facility] Adult PT Treatment/Exercise - 07/30/18 0001      Neck Exercises: Supine   Other Supine Exercise  lay with cervical on ball massager to release the muscles with nodding      Lumbar Exercises: Stretches   Active Hamstring Stretch  Right;Left;2 reps;20 seconds   sitting   Pelvic Tilt  5 reps;5 seconds   domed stomach while doing this   Other Lumbar Stretch Exercise  hookly gently moveing knees side to side  Lumbar Exercises: Aerobic   Nustep  level 1, seat #11, arm #11, for 6 minutes and be assessed by PT   did at an even pace     Lumbar Exercises: Supine   Ab Set  10 reps;5 seconds    AB Set Limitations  many verbal cues to not dome stomach    Other Supine Lumbar Exercises  hookly with pressing hand on ball holding 5 seconds 10x             PT Education - 07/30/18 1441    Education Details  Access Code: [email or web address]    Person(s) Educated  Patient    Methods  Explanation;Demonstration;Handout    Comprehension  Verbalized understanding;Returned demonstration       PT Short Term Goals - 06/18/18 0958      PT SHORT TERM GOAL #1   Title  be independent in initial HEP    Time  4    Period  Weeks    Status  New    Target Date  07/16/18      PT SHORT TERM GOAL #2   Title  initiate a walking program to improve endurance for community function    Time  4    Period  Weeks    Status  New    Target Date  07/16/18      PT SHORT TERM GOAL #3   Title  verbalize understanding of pain neuroscience  education and initiate 1 strategy    Time  4    Period  Weeks    Status  New    Target Date  07/16/18        PT Long Term Goals - 06/18/18 0959      PT LONG TERM GOAL #1   Title  be independent in advanced HEP    Time  8    Period  Weeks    Status  New    Target Date  08/13/18      PT LONG TERM GOAL #2   Title  reduce FOTO to < or = to 59% limitation    Time  8    Period  Weeks    Status  New    Target Date  08/13/18      PT LONG TERM GOAL #3   Title  perform regular walking for exercise > or = to 5 days a week    Time  8    Period  Weeks    Status  New    Target Date  08/13/18      PT LONG TERM GOAL #4   Title  report a 30% reduction in neck and lumbar pain with sitting and walking    Time  8    Period  Weeks    Status  New    Target Date  08/13/18      PT LONG TERM GOAL #5   Title  demonstrate Rt and Lt cervical A/ROM rotation to > or = to 75 degrees to improve safety with driving    Time  8    Period  Weeks    Status  New    Target Date  08/13/18            Plan - 07/30/18 1443    Clinical Impression Statement  Patient just started therapy and has not met goals yet. Patient needs to assist his head into upright position when bent to one side. Patient is limited with movement due to pain  and tightness. Patient was edcuated on dry needling and will thing about it. Patient stretches have to be modified due to his size and decreased mobility. Patient reports he has difficulty with left shoulder making it difficult to perform activities with left shoulder. Patient will benefit from skilled PT to address immobility, chronic pain, trigger poits and reduced flexibility.    Personal Factors and Comorbidities  Comorbidity 2    Comorbidities  obesity, chronic low back and cervical pain    Examination-Activity Limitations  Dressing;Sit;Stand;Stairs;Hygiene/Grooming;Bathing;Squat    Examination-Participation Restrictions  Community Activity;Meal Prep;Driving     Stability/Clinical Decision Making  Evolving/Moderate complexity    Rehab Potential  Good    PT Frequency  2x / week    PT Duration  8 weeks    PT Treatment/Interventions  ADLs/Self Care Home Management;Cryotherapy;Electrical Stimulation;Moist Heat;Therapeutic exercise;Therapeutic activities;Functional mobility training;Neuromuscular re-education;Patient/family education;Manual techniques;Passive range of motion;Dry needling;Taping;Spinal Manipulations    PT Next Visit Plan  gentle flexibility for spine, discuss pain neuroscience education next visit, NuStep, dry needling to neck if patient decides to have it    PT Home Exercise Plan  Access Code: [email or web address]    Recommended Other Services  MD signed initial eval    Consulted and Agree with Plan of Care  Patient       Patient will benefit from skilled therapeutic intervention in order to improve the following deficits and impairments:  Pain, Improper body mechanics, Postural dysfunction, Increased muscle spasms, Decreased activity tolerance, Decreased endurance, Decreased range of motion, Decreased strength, Impaired flexibility  Visit Diagnosis: 1. Cervicalgia   2. Chronic bilateral low back pain, unspecified whether sciatica present   3. Muscle weakness (generalized)        Problem List Patient Active Problem List   Diagnosis Date Noted  . Anxiety 12/25/2017    Earlie Counts, PT 07/30/18 2:49 PM   [facility] [address], [place], [place], [address] Phone: [phone]   Fax:  [phone]  Name: Nathaniel Grant MRN: [id number] Date of Birth: 12-Jul-1960

## 2018-07-30 NOTE — Patient Instructions (Signed)
Access Code: [email or web address]  URL: [email or web address]  Date: 07/30/2018  Prepared by: Earlie Counts   Exercises Seated Hamstring Stretch - 2 reps - 1 sets - 20 seconds hold - 1x daily - 7x weekly Seated Cervical Sidebending Stretch - 2 reps - 1 sets - 15 sec hold - 1x daily - 7x weekly Seated Cervical Retraction - 5 reps - 1 sets - 5 sec hold - 1x daily - 7x weekly Supine Transversus Abdominis Bracing - Hands on Ground - 5 reps - 1 sets - 5 sec hold - 2x daily - 7x weekly Patient Education Trigger Point [facility] [address], [place], [place] [address] Phone # [phone] Fax [phone]

## 2018-08-06 ENCOUNTER — Ambulatory Visit: Payer: Medicaid Other | Attending: Orthopaedic Surgery

## 2018-08-06 ENCOUNTER — Other Ambulatory Visit: Payer: Self-pay

## 2018-08-06 DIAGNOSIS — M545 Low back pain: Secondary | ICD-10-CM | POA: Diagnosis present

## 2018-08-06 DIAGNOSIS — M6281 Muscle weakness (generalized): Secondary | ICD-10-CM | POA: Diagnosis present

## 2018-08-06 DIAGNOSIS — M542 Cervicalgia: Secondary | ICD-10-CM | POA: Insufficient documentation

## 2018-08-06 DIAGNOSIS — G8929 Other chronic pain: Secondary | ICD-10-CM | POA: Insufficient documentation

## 2018-08-06 NOTE — Therapy (Signed)
[facility] [address], [address] [place], [place], [address] Phone: [phone]   Fax:  [phone]  Physical Therapy Treatment  Patient Details  Name: Nathaniel Grant MRN: [id number] Date of Birth: 1960/10/26 Referring Provider (PT): Annell GreeningYates, Mark, MD   Encounter Date: 08/06/2018  PT End of Session - 08/06/18 1125    Visit Number  3    Date for PT Re-Evaluation  08/13/18    Authorization Type  Medicaid    Authorization Time Period  6/12-7/9    Authorization - Visit Number  3    Authorization - Number of Visits  4    PT Start Time  1032    PT Stop Time  1133    PT Time Calculation (min)  61 min    Activity Tolerance  Patient tolerated treatment well;No increased pain    Behavior During Therapy  WFL for tasks assessed/performed       Past Medical History:  Diagnosis Date  . Depression     History reviewed. No pertinent surgical history.  There were no vitals filed for this visit.  Subjective Assessment - 08/06/18 1033    Diagnostic tests  Pt had x-ray: C5-6 and C6-7 facet arthropahty and spurring.  Lumbar spondylosis: L2-3, L5-S1.    Patient Stated Goals  reduce pain, sit/stand/walk longer    Currently in Pain?  Yes    Pain Score  7     Pain Location  Back    Pain Orientation  Left;Right    Pain Descriptors / Indicators  Aching;Tightness    Pain Type  Chronic pain    Pain Onset  More than a month ago    Pain Frequency  Constant    Aggravating Factors   standing to cook, washing dishes, walking    Pain Relieving Factors  Naproxen    Pain Score  8    Pain Location  Neck    Pain Orientation  Right;Left    Pain Descriptors / Indicators  Numbness;Aching;Tightness    Pain Type  Chronic pain    Pain Onset  More than a month ago    Pain Frequency  Constant    Aggravating Factors   sitting at the computer    Pain Relieving Factors  change of positions, nothing                       [facility] Adult PT  Treatment/Exercise - 08/06/18 0001      Lumbar Exercises: Stretches   Active Hamstring Stretch  Right;Left;20 seconds;3 reps   sitting   Pelvic Tilt  5 reps;5 seconds   difficulty contracting with this   Other Lumbar Stretch Exercise  hookly gently moveing knees side to side      Lumbar Exercises: Aerobic   Nustep  level 1, seat #11, arm #11, for 6 minutes and be assessed by PT   did at an even pace     Lumbar Exercises: Supine   Ab Set  10 reps;5 seconds    AB Set Limitations  many verbal cues to not dome stomach, improved with more reps    Other Supine Lumbar Exercises  hookly with pressing hands on balls      Modalities   Modalities  Moist Heat;Electrical Stimulation      Moist Heat Therapy   Number Minutes Moist Heat  15 Minutes    Moist Heat Location  Lumbar Spine;Cervical      Electrical Stimulation   Electrical  Stimulation Location  neck and lumbar spine     Electrical Stimulation Action  premod    Electrical Stimulation Parameters  15 minutes     Electrical Stimulation Goals  Pain               PT Short Term Goals - 06/18/18 0958      PT SHORT TERM GOAL #1   Title  be independent in initial HEP    Time  4    Period  Weeks    Status  New    Target Date  07/16/18      PT SHORT TERM GOAL #2   Title  initiate a walking program to improve endurance for community function    Time  4    Period  Weeks    Status  New    Target Date  07/16/18      PT SHORT TERM GOAL #3   Title  verbalize understanding of pain neuroscience education and initiate 1 strategy    Time  4    Period  Weeks    Status  New    Target Date  07/16/18        PT Long Term Goals - 06/18/18 0959      PT LONG TERM GOAL #1   Title  be independent in advanced HEP    Time  8    Period  Weeks    Status  New    Target Date  08/13/18      PT LONG TERM GOAL #2   Title  reduce FOTO to < or = to 59% limitation    Time  8    Period  Weeks    Status  New    Target Date  08/13/18       PT LONG TERM GOAL #3   Title  perform regular walking for exercise > or = to 5 days a week    Time  8    Period  Weeks    Status  New    Target Date  08/13/18      PT LONG TERM GOAL #4   Title  report a 30% reduction in neck and lumbar pain with sitting and walking    Time  8    Period  Weeks    Status  New    Target Date  08/13/18      PT LONG TERM GOAL #5   Title  demonstrate Rt and Lt cervical A/ROM rotation to > or = to 75 degrees to improve safety with driving    Time  8    Period  Weeks    Status  New    Target Date  08/13/18            Plan - 08/06/18 1053    Clinical Impression Statement  Pt reports no change in pain and has been moderately compliant with HEP.  He reports that he does the exercises when pain allows.  Pt demonstrates significant guarding with movement today.  Pt was able to participate in low level movement exercises and PT monitored for pain. Pt had difficulty with abdominal contraction today and required many tactile and verbal cues.  PT used electrical stimulation at the end of session for pain management.  Pt with chronic pain and will continue to benefit from skilled PT for gentle mobility, strength and manual/modalities for pain management.    Rehab Potential  Good    PT Frequency  2x /  week    PT Duration  8 weeks    PT Treatment/Interventions  ADLs/Self Care Home Management;Cryotherapy;Electrical Stimulation;Moist Heat;Therapeutic exercise;Therapeutic activities;Functional mobility training;Neuromuscular re-education;Patient/family education;Manual techniques;Passive range of motion;Dry needling;Taping;Spinal Manipulations    PT Next Visit Plan  request more Medicaid visits, gentle flexiblity, discuss pain neuroscience education    PT Home Exercise Plan  Access Code: [email or web address]    Consulted and Agree with Plan of Care  Patient       Patient will benefit from skilled therapeutic intervention in order to improve the following deficits and  impairments:  Pain, Improper body mechanics, Postural dysfunction, Increased muscle spasms, Decreased activity tolerance, Decreased endurance, Decreased range of motion, Decreased strength, Impaired flexibility  Visit Diagnosis: 1. Cervicalgia   2. Chronic bilateral low back pain, unspecified whether sciatica present   3. Muscle weakness (generalized)        Problem List Patient Active Problem List   Diagnosis Date Noted  . Anxiety 12/25/2017    Sigurd Sos, PT 08/06/18 11:28 AM  [facility] [address] [place], [place], [address] Phone: [phone]   Fax:  [phone]  Name: Nathaniel Grant MRN: [id number] Date of Birth: May 10, 1960

## 2018-08-11 ENCOUNTER — Encounter: Payer: Self-pay | Admitting: Physical Therapy

## 2018-08-11 ENCOUNTER — Other Ambulatory Visit: Payer: Self-pay

## 2018-08-11 ENCOUNTER — Ambulatory Visit: Payer: Medicaid Other | Admitting: Physical Therapy

## 2018-08-11 DIAGNOSIS — M545 Low back pain, unspecified: Secondary | ICD-10-CM

## 2018-08-11 DIAGNOSIS — M6281 Muscle weakness (generalized): Secondary | ICD-10-CM

## 2018-08-11 DIAGNOSIS — G8929 Other chronic pain: Secondary | ICD-10-CM

## 2018-08-11 DIAGNOSIS — M542 Cervicalgia: Secondary | ICD-10-CM

## 2018-08-11 NOTE — Therapy (Signed)
[facility] [address], [address] [place], [place], [address] Phone: [phone]   Fax:  [phone]  Physical Therapy Treatment  Patient Details  Name: Nathaniel Grant MRN: [id number] Date of Birth: 05-Apr-1960 Referring Provider (PT): Annell GreeningYates, Mark, MD   Encounter Date: 08/11/2018  PT End of Session - 08/11/18 1134    Visit Number  4    Date for PT Re-Evaluation  09/25/18    Authorization Type  Medicaid; sent new auth on 7/6    Authorization Time Period  6/12-7/9    Authorization - Visit Number  4    Authorization - Number of Visits  4    PT Start Time  1100    PT Stop Time  1150    PT Time Calculation (min)  50 min    Activity Tolerance  Patient tolerated treatment well;No increased pain    Behavior During Therapy  WFL for tasks assessed/performed       Past Medical History:  Diagnosis Date  . Depression     History reviewed. No pertinent surgical history.  There were no vitals filed for this visit.  Subjective Assessment - 08/11/18 1105    Subjective  Walking hurts. Hard to sleep. I feel I need pain management. I see my MD in several weeks. Seeing someone for the psyschological issues.    Pertinent History  chronic cervical and lumbar pain, diverticulitis, depression    Limitations  Standing;Sitting;Walking    How long can you sit comfortably?  30 minutes max    How long can you stand comfortably?  very limited < 5 minutes    How long can you walk comfortably?  walking limited in the community- 10 minutes limitation    Diagnostic tests  Pt had x-ray: C5-6 and C6-7 facet arthropahty and spurring.  Lumbar spondylosis: L2-3, L5-S1.    Patient Stated Goals  reduce pain, sit/stand/walk longer    Currently in Pain?  Yes    Pain Score  7     Pain Location  Back    Pain Orientation  Right;Left    Pain Descriptors / Indicators  Aching;Tightness    Pain Type  Chronic pain    Pain Onset  More than a month ago    Pain  Frequency  Constant    Aggravating Factors   standing to cook, washing dishes, walking    Pain Relieving Factors  Naproxen    Multiple Pain Sites  Yes    Pain Score  8    Pain Location  Neck    Pain Orientation  Right;Left    Pain Descriptors / Indicators  Tightness;Numbness;Aching    Pain Type  Chronic pain    Pain Onset  More than a month ago    Pain Frequency  Constant    Aggravating Factors   sitting at the computer    Pain Relieving Factors  change of positions, nothing         [facility] - 08/11/18 0001      Assessment   Medical Diagnosis  chronic bilateral low back pain, neck pain    Referring Provider (PT)  Annell GreeningYates, Mark, MD    Hand Dominance  Left    Next MD Visit  unknown      Precautions   Precautions  None      Restrictions   Weight Bearing Restrictions  No      Home Environment   Living Environment  Private residence    Living  Arrangements  Other relatives      Prior Function   Level of Independence  Independent    Vocation  Unemployed    Leisure  computer- Counselling psychologist, plays guitar      Cognition   Overall Cognitive Status  Within Functional Limits for tasks assessed      Observation/Other Assessments   Focus on Therapeutic Outcomes (FOTO)   72% limitation      Posture/Postural Control   Posture/Postural Control  Postural limitations    Postural Limitations  Forward head;Flexed trunk;Increased thoracic kyphosis      ROM / Strength   AROM / PROM / Strength  PROM;AROM;Strength      AROM   Overall AROM   Deficits    Overall AROM Comments  cervical A/ROM is full except rotation limited by 25% bilaterally.  All A/ROM with pain.  Lumbar A/ROM is limited by 25% in all directions due to pain.  Lt shoulder limited by >50% due to pain      Strength   Overall Strength  Deficits    Overall Strength Comments  Lt UE 4-/5, Rt UE 4+/5, LEs: knees 4+/5, hips 4-/5      Palpation   Palpation comment  pt with trigger points in bil upper traps, cervical  paraspinals, thoracic paraspinals and gluteals bilaterally      Transfers   Transfers  Stand to Sit;Sit to Stand    Sit to Stand  6: Modified independent (Device/Increase time)    Five time sit to stand comments   48 sec. very slow, used hands    Stand to Sit  With upper extremity assist      Ambulation/Gait   Ambulation/Gait  Yes    Ambulation/Gait Assistance  6: Modified independent (Device/Increase time)    Gait Pattern  Step-through pattern;Decreased stride length                   [facility] Adult PT Treatment/Exercise - 08/11/18 0001      Lumbar Exercises: Aerobic   Nustep  level 1, seat #11, arm #11, for 7 minutes and be assessed by PT   did at an even pace     Lumbar Exercises: Seated   Other Seated Lumbar Exercises  alternate hip flexion 20x    Other Seated Lumbar Exercises  bring hand to knee on same side 10 x; has trouble with left arm due to injury to the RTC      Knee/Hip Exercises: Standing   Functional Squat  1 set;10 reps   holding onto the treadmill     Knee/Hip Exercises: Seated   Ball Squeeze  10x hold 5 sec with abdominal contraction      Modalities   Modalities  Electrical Stimulation;Moist Heat      Moist Heat Therapy   Number Minutes Moist Heat  15 Minutes    Moist Heat Location  Lumbar Spine;Cervical      Electrical Stimulation   Electrical Stimulation Location  lumbar    Electrical Stimulation Action  IFC    Electrical Stimulation Parameters  15 min    Electrical Stimulation Goals  Pain               PT Short Term Goals - 08/11/18 1140      PT SHORT TERM GOAL #1   Title  be independent in initial HEP    Time  4    Period  Weeks    Status  On-going      PT  SHORT TERM GOAL #2   Title  initiate a walking program to improve endurance for community function    Time  4    Period  Weeks    Status  On-going    Target Date  07/16/18      PT SHORT TERM GOAL #3   Title  verbalize understanding of pain neuroscience education  and initiate 1 strategy    Time  4    Period  Weeks    Status  On-going    Target Date  07/16/18        PT Long Term Goals - 06/18/18 0959      PT LONG TERM GOAL #1   Title  be independent in advanced HEP    Time  8    Period  Weeks    Status  New    Target Date  08/13/18      PT LONG TERM GOAL #2   Title  reduce FOTO to < or = to 59% limitation    Time  8    Period  Weeks    Status  New    Target Date  08/13/18      PT LONG TERM GOAL #3   Title  perform regular walking for exercise > or = to 5 days a week    Time  8    Period  Weeks    Status  New    Target Date  08/13/18      PT LONG TERM GOAL #4   Title  report a 30% reduction in neck and lumbar pain with sitting and walking    Time  8    Period  Weeks    Status  New    Target Date  08/13/18      PT LONG TERM GOAL #5   Title  demonstrate Rt and Lt cervical A/ROM rotation to > or = to 75 degrees to improve safety with driving    Time  8    Period  Weeks    Status  New    Target Date  08/13/18            Plan - 08/11/18 1135    Clinical Impression Statement  Patient was able to do 4 more exercises than last visit. Patient is in constant pain that he is having trouble managing. Patient is seeing a psychologist for dealing with trauma in his life and the constant pain. Patient has difficulty using his left arm due to an old RTC injury. Patient has difficulty engaging his abdominals but does better in sitting. Patient was able to do 1 extra minute on the nustep. Patient has to help his feet into the nustep due to pain and weakness in his hips. Patient has decreased cervical and lumbar movement. Patient has weakness in his legs and arms due to pain and decondition. Patient is trying the electrical stimulation to see if helps with pain relief. Patient will benefit from skilled therapy to improve mobility, strength and manual with modalities for pain management.    Personal Factors and Comorbidities  Comorbidity 2     Comorbidities  obesity, chronic low back and cervical pain    Examination-Activity Limitations  Dressing;Sit;Stand;Stairs;Hygiene/Grooming;Bathing;Squat    Examination-Participation Restrictions  Community Activity;Meal Prep;Driving    Stability/Clinical Decision Making  Evolving/Moderate complexity    PT Frequency  2x / week    PT Duration  6 weeks    PT Treatment/Interventions  ADLs/Self Care Home Management;Cryotherapy;Electrical Stimulation;Moist Heat;Therapeutic  exercise;Therapeutic activities;Functional mobility training;Neuromuscular re-education;Patient/family education;Manual techniques;Passive range of motion;Dry needling;Taping;Spinal Manipulations    PT Next Visit Plan  gentle flexibilty, discuss pain neuroscience education, trunk isometrics to engage core    PT Home Exercise Plan  Access Code: [email or web address]    Recommended Other Services  sent MD renewal on 08/11/2018    Consulted and Agree with Plan of Care  Patient       Patient will benefit from skilled therapeutic intervention in order to improve the following deficits and impairments:  Pain, Improper body mechanics, Postural dysfunction, Increased muscle spasms, Decreased activity tolerance, Decreased endurance, Decreased range of motion, Decreased strength, Impaired flexibility  Visit Diagnosis: 1. Cervicalgia   2. Chronic bilateral low back pain, unspecified whether sciatica present   3. Muscle weakness (generalized)        Problem List Patient Active Problem List   Diagnosis Date Noted  . Anxiety 12/25/2017    Eulis Fosterheryl Raven Harmes, PT 08/11/18 11:43 AM   [facility] [address], [address] [place], [place], [address] Phone: [phone]   Fax:  [phone]  Name: Nathaniel Grant MRN: [id number] Date of Birth: December 25, 1960

## 2018-08-13 DIAGNOSIS — F3130 Bipolar disorder, current episode depressed, mild or moderate severity, unspecified: Secondary | ICD-10-CM | POA: Insufficient documentation

## 2018-08-13 DIAGNOSIS — Z6841 Body Mass Index (BMI) 40.0 and over, adult: Secondary | ICD-10-CM | POA: Insufficient documentation

## 2018-08-20 ENCOUNTER — Ambulatory Visit: Payer: Medicaid Other | Admitting: Orthopaedic Surgery

## 2018-08-20 ENCOUNTER — Ambulatory Visit: Payer: Medicaid Other | Admitting: Physical Therapy

## 2018-08-20 ENCOUNTER — Other Ambulatory Visit: Payer: Self-pay

## 2018-08-20 DIAGNOSIS — M542 Cervicalgia: Secondary | ICD-10-CM

## 2018-08-20 DIAGNOSIS — M6281 Muscle weakness (generalized): Secondary | ICD-10-CM

## 2018-08-20 DIAGNOSIS — G8929 Other chronic pain: Secondary | ICD-10-CM

## 2018-08-20 NOTE — Patient Instructions (Addendum)
     [facility] Welcome to [facility]! Here you will find all the information you will need regarding your pool therapy. If you have further questions at any time, please call our office at [phone]. After completing your initial evaluation in the [facility] clinic, you may be eligible to complete a portion of your therapy in the pool. A typical week of therapy will consist of 1-2 typical physical therapy visits at our [place] location and an additional session of therapy in the pool located at the [facility] [place], [place], [place] [address].  Aquatic therapy will be offered on Friday afternoons. Each session will last approximately 30 minutes. All scheduling and payments for aquatic therapy sessions, including cancelations, will be done through our [place] location.  To be eligible for aquatic therapy, these criteria must be met: . You must be able to independently change in the locker room and get to the pool deck. A caregiver can come with you to help if needed. There are bleachers for a caregiver to sit on next to the pool. . No one with an open wound is permitted in the pool.  Handicap parking is available in the front and there is a drop off option for even closer accessibility. Please arrive 15 minutes prior to your appointment to prepare for your pool session. You must sign in at the front desk upon your arrival. Please be sure to attend to any toileting needs prior to entering the pool. [place] rooms for changing are located to the right of the check-in desk. There is direct access to the pool deck from the locker room. You can lock your belongings in a locker but must bring a lock. Your therapist will greet you on the pool deck. There may be other swimmers in the pool at the same time but your session is one-on-one with the therapist. [facility] [address], [place], [place]  [address] Phone # [phone] Fax [phone]  Access Code: [email or web address]  URL: [email or web address]  Date: 08/20/2018  Prepared by: Earlie Counts   Exercises Seated Hamstring Stretch - 2 reps - 1 sets - 20 seconds hold - 1x daily - 7x weekly Seated Cervical Sidebending Stretch - 2 reps - 1 sets - 15 sec hold - 1x daily - 7x weekly Seated Cervical Retraction - 5 reps - 1 sets - 5 sec hold - 1x daily - 7x weekly Supine Transversus Abdominis Bracing - Hands on Ground - 5 reps - 1 sets - 5 sec hold - 2x daily - 7x weekly Supine Lower Trunk Rotation - 2 reps - 1 sets - 15 sec hold - 1x daily - 7x weekly Patient Education Trigger Point Dry Needling

## 2018-08-20 NOTE — Therapy (Signed)
[facility] [address], [address] [place], [place], [address] Phone: [phone]   Fax:  [phone]  Physical Therapy Treatment  Patient Details  Name: Nathaniel Grant MRN: [id number] Date of Birth: 06/04/1960 Referring Provider (PT): Annell GreeningYates, Mark, MD   Encounter Date: 08/20/2018  PT End of Session - 08/20/18 1306    Visit Number  5    Date for PT Re-Evaluation  09/25/18    Authorization Type  Medicaid; sent new auth on 7/6    Authorization Time Period  08/18/18-09/28/18    Authorization - Visit Number  1    Authorization - Number of Visits  12    PT Start Time  1300    PT Stop Time  1340    PT Time Calculation (min)  40 min    Activity Tolerance  Patient tolerated treatment well;No increased pain    Behavior During Therapy  WFL for tasks assessed/performed       Past Medical History:  Diagnosis Date  . Depression     No past surgical history on file.  There were no vitals filed for this visit.  Subjective Assessment - 08/20/18 1307    Subjective  I am not doing my exercises consistently. I feel better if I could exercise in the water.    Pertinent History  chronic cervical and lumbar pain, diverticulitis, depression    Limitations  Standing;Sitting;Walking    How long can you sit comfortably?  30 minutes max    How long can you stand comfortably?  very limited < 5 minutes    How long can you walk comfortably?  walking limited in the community- 10 minutes limitation    Diagnostic tests  Pt had x-ray: C5-6 and C6-7 facet arthropahty and spurring.  Lumbar spondylosis: L2-3, L5-S1.    Patient Stated Goals  reduce pain, sit/stand/walk longer    Currently in Pain?  Yes    Pain Score  7     Pain Location  Back    Pain Orientation  Right;Left    Pain Descriptors / Indicators  Aching;Tightness    Pain Type  Chronic pain    Pain Onset  More than a month ago    Pain Frequency  Constant    Aggravating Factors   standing to  cook, washing dishes, walking    Pain Relieving Factors  naproxen    Multiple Pain Sites  No    Pain Score  7    Pain Location  Neck    Pain Orientation  Right;Left    Pain Descriptors / Indicators  Aching;Numbness;Tightness    Pain Type  Chronic pain    Pain Onset  More than a month ago    Pain Frequency  Constant    Aggravating Factors   sitting at the computer    Pain Relieving Factors  change of positions, nothing         [facility] 08/20/18 0001      Assessment   Medical Diagnosis  chronic bilateral low back pain, neck pain    Referring Provider (PT)  Annell GreeningYates, Mark, MD    Hand Dominance  Left    Next MD Visit  unknown      Precautions   Precautions  None      Restrictions   Weight Bearing Restrictions  No      Home Environment   Living Environment  Private residence    Living Arrangements  Other relatives  Prior Function   Level of Independence  Independent    Vocation  Unemployed    Leisure  computer- Social workergame design, plays guitar      Cognition   Overall Cognitive Status  Within Functional Limits for tasks assessed      Observation/Other Assessments   Focus on Therapeutic Outcomes (FOTO)   72% limitation      Posture/Postural Control   Posture/Postural Control  Postural limitations    Postural Limitations  Forward head;Flexed trunk;Increased thoracic kyphosis      AROM   Overall AROM   Deficits    Overall AROM Comments  cervical A/ROM is full except rotation limited by 25% bilaterally.  All A/ROM with pain.  Lumbar A/ROM is limited by 25% in all directions due to pain.  Lt shoulder limited by >50% due to pain      Strength   Overall Strength  Deficits    Overall Strength Comments  Lt UE 4-/5, Rt UE 4+/5, LEs: knees 4+/5, hips 4-/5      Palpation   Palpation comment  pt with trigger points in bil upper traps, cervical paraspinals, thoracic paraspinals and gluteals bilaterally      Transfers   Transfers  Stand to Sit;Sit to Stand    Sit to  Stand  6: Modified independent (Device/Increase time)    Five time sit to stand comments   48 sec. very slow, used hands    Stand to Sit  With upper extremity assist      Ambulation/Gait   Ambulation/Gait Assistance  6: Modified independent (Device/Increase time)    Gait Pattern  Step-through pattern;Decreased stride length                   [facility] Adult PT Treatment/Exercise - 08/20/18 0001      Ambulation/Gait   Ambulation/Gait  Yes    Gait Comments  instruction on bigger step length  and heel toe but patient is resistance to use heel toe gait due to the shock up his back      Lumbar Exercises: Stretches   Lower Trunk Rotation  4 reps;10 seconds   2times each side   Other Lumbar Stretch Exercise  hookly with abdominal brace and using the right arm to move the left arm 5x      Lumbar Exercises: Aerobic   Nustep  level 1, seat #11, arm #11, for 7 minutes and be assessed by PT   did at an even pace; hill program     Lumbar Exercises: Supine   Ab Set  10 reps;5 reps   needed minimal VC   Other Supine Lumbar Exercises  hookly ball squeeze with abdominal bracing holding 5 sec 10x             PT Education - 08/20/18 1351    Education Details  Access Code: [email or web address]; information on aquatic exercise and where it is at    [employer]) Educated  Patient    Methods  Explanation;Demonstration;Handout    Comprehension  Returned demonstration;Verbalized understanding       PT Short Term Goals - 08/11/18 1140      PT SHORT TERM GOAL #1   Title  be independent in initial HEP    Time  4    Period  Weeks    Status  On-going      PT SHORT TERM GOAL #2   Title  initiate a walking program to improve endurance for community function    Time  4  Period  Weeks    Status  On-going    Target Date  07/16/18      PT SHORT TERM GOAL #3   Title  verbalize understanding of pain neuroscience education and initiate 1 strategy    Time  4    Period  Weeks    Status  On-going     Target Date  07/16/18        PT Long Term Goals - 06/18/18 0959      PT LONG TERM GOAL #1   Title  be independent in advanced HEP    Time  8    Period  Weeks    Status  New    Target Date  08/13/18      PT LONG TERM GOAL #2   Title  reduce FOTO to < or = to 59% limitation    Time  8    Period  Weeks    Status  New    Target Date  08/13/18      PT LONG TERM GOAL #3   Title  perform regular walking for exercise > or = to 5 days a week    Time  8    Period  Weeks    Status  New    Target Date  08/13/18      PT LONG TERM GOAL #4   Title  report a 30% reduction in neck and lumbar pain with sitting and walking    Time  8    Period  Weeks    Status  New    Target Date  08/13/18      PT LONG TERM GOAL #5   Title  demonstrate Rt and Lt cervical A/ROM rotation to > or = to 75 degrees to improve safety with driving    Time  8    Period  Weeks    Status  New    Target Date  08/13/18            Plan - 08/20/18 1314    Clinical Impression Statement  Patient reports he is able to exercise better in the pool. We start our aquatic program in August so would like to add it to his program. Patient is limited with his mobility. Patient has not been consistent with his home program so we reviewed it and added giving him a new handout. Patient is now able to perform abdominal bracing with minimal verbal cues. Patient will hold his breath with transitional movements. Patient will benefit from skilled therapy to improve mobility, strength and manual with mocialities for pain management.    Personal Factors and Comorbidities  Comorbidity 2    Comorbidities  obesity, chronic low back and cervical pain    Examination-Activity Limitations  Dressing;Sit;Stand;Stairs;Hygiene/Grooming;Bathing;Squat    Examination-Participation Restrictions  Community Activity;Meal Prep;Driving    Stability/Clinical Decision Making  Evolving/Moderate complexity    Rehab Potential  Good    PT Frequency  2x  / week    PT Duration  6 weeks    PT Treatment/Interventions  ADLs/Self Care Home Management;Cryotherapy;Electrical Stimulation;Moist Heat;Therapeutic exercise;Therapeutic activities;Functional mobility training;Neuromuscular re-education;Patient/family education;Manual techniques;Passive range of motion;Dry needling;Taping;Spinal Manipulations;Aquatic Therapy    PT Next Visit Plan  gentle flexibilty, trunk isometrics to engage core; added aquatic therapy starting on 09/12/18    PT Home Exercise Plan  Access Code: [email or web address]    Recommended Other Services  MD signed the renewal    Consulted and Agree with Plan of Care  Patient  Patient will benefit from skilled therapeutic intervention in order to improve the following deficits and impairments:  Pain, Improper body mechanics, Postural dysfunction, Increased muscle spasms, Decreased activity tolerance, Decreased endurance, Decreased range of motion, Decreased strength, Impaired flexibility  Visit Diagnosis: 1. Cervicalgia   2. Chronic bilateral low back pain, unspecified whether sciatica present   3. Muscle weakness (generalized)        Problem List Patient Active Problem List   Diagnosis Date Noted  . Anxiety 12/25/2017    Eulis Fosterheryl , PT 08/20/18 4:00 PM   [facility] [address], [address] [place], [place], [address] Phone: [phone]   Fax:  [phone]  Name: Nathaniel Grant MRN: [id number] Date of Birth: Jan 29, 1961

## 2018-08-22 ENCOUNTER — Ambulatory Visit: Payer: Medicaid Other | Admitting: Orthopaedic Surgery

## 2018-08-26 ENCOUNTER — Ambulatory Visit: Payer: Medicaid Other | Admitting: Physical Therapy

## 2018-08-26 ENCOUNTER — Other Ambulatory Visit: Payer: Self-pay

## 2018-08-26 ENCOUNTER — Encounter: Payer: Self-pay | Admitting: Physical Therapy

## 2018-08-26 DIAGNOSIS — M542 Cervicalgia: Secondary | ICD-10-CM | POA: Diagnosis not present

## 2018-08-26 DIAGNOSIS — M6281 Muscle weakness (generalized): Secondary | ICD-10-CM

## 2018-08-26 DIAGNOSIS — G8929 Other chronic pain: Secondary | ICD-10-CM

## 2018-08-26 NOTE — Therapy (Signed)
[facility] [address], [address] [place], [place], [address] Phone: [phone]   Fax:  [phone]  Physical Therapy Treatment  Patient Details  Name: Nathaniel Grant MRN: [id number] Date of Birth: [birth year] Referring Provider (PT): Annell GreeningYates, Mark, MD   Encounter Date: 08/26/2018  PT End of Session - 08/26/18 1343    Visit Number  6    Date for PT Re-Evaluation  09/25/18    Authorization Type  Medicaid; sent new auth on 7/6    Authorization Time Period  08/18/18-09/28/18    Authorization - Visit Number  2    Authorization - Number of Visits  12    PT Start Time  1300    PT Stop Time  1343    PT Time Calculation (min)  43 min    Activity Tolerance  Patient tolerated treatment well;No increased pain    Behavior During Therapy  WFL for tasks assessed/performed       Past Medical History:  Diagnosis Date  . Depression     History reviewed. No pertinent surgical history.  There were no vitals filed for this visit.  Subjective Assessment - 08/26/18 1308    Subjective  My pain increased. the pain level is increased due to laying around and sleeping more. My back is bothering me more than the back.    Pertinent History  chronic cervical and lumbar pain, diverticulitis, depression    Limitations  Standing;Sitting;Walking    How long can you sit comfortably?  30 minutes max    How long can you stand comfortably?  very limited < 5 minutes    Diagnostic tests  Pt had x-ray: C5-6 and C6-7 facet arthropahty and spurring.  Lumbar spondylosis: L2-3, L5-S1.    Patient Stated Goals  reduce pain, sit/stand/walk longer    Currently in Pain?  Yes    Pain Score  8     Pain Location  Back    Pain Orientation  Right;Left    Pain Descriptors / Indicators  Aching;Tightness    Pain Type  Chronic pain    Pain Onset  More than a month ago    Pain Frequency  Constant    Aggravating Factors   standing to cook, washing dishes, walking    Pain  Relieving Factors  naproxen    Multiple Pain Sites  No                       [facility] Adult PT Treatment/Exercise - 08/26/18 0001      Lumbar Exercises: Aerobic   Nustep  level 2, seat #11, arm #11, for 7 minutes and be assessed by PT   did at an even pace; Profile 5     Lumbar Exercises: Seated   Other Seated Lumbar Exercises  alternate hip flexion 20x    Other Seated Lumbar Exercises  sitting clam with green band with abdominal bracing 20x      Lumbar Exercises: Supine   Bent Knee Raise  20 reps;1 second   abdominal bracing   Other Supine Lumbar Exercises  supine with pressing hands into the red phyoball to engage the abdominals    Other Supine Lumbar Exercises  hookly ball squeeze with abdominal bracing holding 5 sec 10x      Knee/Hip Exercises: Standing   Wall Squat  1 set;5 reps;5 seconds   hurt back and left knee   Other Standing Knee Exercises  standing pelvic tilt hold 5 sec to  work on mobility and core strength;                PT Short Term Goals - 08/26/18 1347      PT SHORT TERM GOAL #1   Title  be independent in initial HEP    Time  4    Period  Weeks    Status  Achieved    Target Date  07/16/18      PT SHORT TERM GOAL #2   Title  initiate a walking program to improve endurance for community function    Time  4    Period  Weeks    Status  On-going      PT SHORT TERM GOAL #3   Title  verbalize understanding of pain neuroscience education and initiate 1 strategy    Time  4    Period  Weeks    Status  Achieved    Target Date  07/16/18        PT Long Term Goals - 06/18/18 0959      PT LONG TERM GOAL #1   Title  be independent in advanced HEP    Time  8    Period  Weeks    Status  New    Target Date  08/13/18      PT LONG TERM GOAL #2   Title  reduce FOTO to < or = to 59% limitation    Time  8    Period  Weeks    Status  New    Target Date  08/13/18      PT LONG TERM GOAL #3   Title  perform regular walking for exercise  > or = to 5 days a week    Time  8    Period  Weeks    Status  New    Target Date  08/13/18      PT LONG TERM GOAL #4   Title  report a 30% reduction in neck and lumbar pain with sitting and walking    Time  8    Period  Weeks    Status  New    Target Date  08/13/18      PT LONG TERM GOAL #5   Title  demonstrate Rt and Lt cervical A/ROM rotation to > or = to 75 degrees to improve safety with driving    Time  8    Period  Weeks    Status  New    Target Date  08/13/18            Plan - 08/26/18 1343    Clinical Impression Statement  Patient was able to exercise without increase in pain. Patient does better with changing position to exercise. Patient is able to brace her abdominals without verbal cues. Patient back hurts more than his neck. Patient is having difficulty with pain management due to his pain medication is not helping alot so he will sit or sleep. Patient able to get out of the chair with greater ease. Patient will benefit from skilled therapy to improve mobility, strength so he is able to function with greater ease.    Comorbidities  obesity, chronic low back and cervical pain    Examination-Activity Limitations  Dressing;Sit;Stand;Stairs;Hygiene/Grooming;Bathing;Squat    Examination-Participation Restrictions  Community Activity;Meal Prep;Driving    Stability/Clinical Decision Making  Evolving/Moderate complexity    Rehab Potential  Good    PT Frequency  2x / week    PT Duration  6  weeks    PT Treatment/Interventions  ADLs/Self Care Home Management;Cryotherapy;Electrical Stimulation;Moist Heat;Therapeutic exercise;Therapeutic activities;Functional mobility training;Neuromuscular re-education;Patient/family education;Manual techniques;Passive range of motion;Dry needling;Taping;Spinal Manipulations;Aquatic Therapy    PT Next Visit Plan  gentle flexibilty, trunk isometrics to engage core; added aquatic therapy starting on 09/12/18; change positions with exercise; discuss  walking program    PT Home Exercise Plan  Access Code: [email or web address]    Consulted and Agree with Plan of Care  Patient       Patient will benefit from skilled therapeutic intervention in order to improve the following deficits and impairments:  Pain, Improper body mechanics, Postural dysfunction, Increased muscle spasms, Decreased activity tolerance, Decreased endurance, Decreased range of motion, Decreased strength, Impaired flexibility  Visit Diagnosis: 1. Cervicalgia   2. Chronic bilateral low back pain, unspecified whether sciatica present   3. Muscle weakness (generalized)        Problem List Patient Active Problem List   Diagnosis Date Noted  . Anxiety 12/25/2017    Eulis Fosterheryl Avory Rahimi, PT 08/26/18 1:49 PM   [facility] [address], [address] [place], [place], [address] Phone: [phone]   Fax:  [phone]  Name: Nathaniel Grant MRN: [id number] Date of Birth: 1960-09-06

## 2018-08-28 ENCOUNTER — Other Ambulatory Visit: Payer: Self-pay

## 2018-08-28 ENCOUNTER — Ambulatory Visit: Payer: Medicaid Other

## 2018-08-28 ENCOUNTER — Encounter: Payer: Medicaid Other | Admitting: Physical Therapy

## 2018-08-28 DIAGNOSIS — M545 Low back pain, unspecified: Secondary | ICD-10-CM

## 2018-08-28 DIAGNOSIS — M542 Cervicalgia: Secondary | ICD-10-CM

## 2018-08-28 DIAGNOSIS — G8929 Other chronic pain: Secondary | ICD-10-CM

## 2018-08-28 DIAGNOSIS — M6281 Muscle weakness (generalized): Secondary | ICD-10-CM

## 2018-08-28 NOTE — Therapy (Signed)
[facility] [address], [address] [place], [place], [address] Phone: [phone]   Fax:  [phone]  Physical Therapy Treatment  Patient Details  Name: Nathaniel Grant MRN: [id number] Date of Birth: 07/10/1960 Referring Provider (PT): Annell GreeningYates, Mark, MD   Encounter Date: 08/28/2018  PT End of Session - 08/28/18 1423    Visit Number  7    Date for PT Re-Evaluation  09/25/18    Authorization Type  Medicaid; sent new auth on 7/6    Authorization Time Period  08/18/18-09/28/18    Authorization - Visit Number  3    Authorization - Number of Visits  12    PT Start Time  1338    PT Stop Time  1424    PT Time Calculation (min)  46 min    Activity Tolerance  Patient tolerated treatment well;No increased pain    Behavior During Therapy  WFL for tasks assessed/performed       Past Medical History:  Diagnosis Date  . Depression     History reviewed. No pertinent surgical history.  There were no vitals filed for this visit.  Subjective Assessment - 08/28/18 1339    Subjective  I am doing my exercises as able.  I think the water therapy will really help.    Currently in Pain?  Yes    Pain Score  7     Pain Location  Back    Pain Orientation  Left;Right    Pain Descriptors / Indicators  Aching;Tightness    Pain Type  Chronic pain    Pain Onset  More than a month ago    Pain Frequency  Constant    Aggravating Factors   standing, washing dishes, walking    Pain Relieving Factors  naproxen    Pain Score  7    Pain Location  Neck    Pain Orientation  Right;Left    Pain Descriptors / Indicators  Aching;Numbness    Pain Type  Chronic pain    Pain Onset  More than a month ago    Pain Frequency  Constant    Aggravating Factors   siting a the computer    Pain Relieving Factors  change of position, nothing                       [facility] Adult PT Treatment/Exercise - 08/28/18 0001      Lumbar Exercises: Stretches   Lower Trunk Rotation  4 reps;10 seconds   2times each side     Lumbar Exercises: Aerobic   UBE (Upper Arm Bike)  standing: Level 1 x 4 minutes (Pt with LBP with standing)   to address endurance   Nustep  level 2, seat #11, arm #11, for 10 minutes- Program 5   did at an even pace; Profile 5     Lumbar Exercises: Seated   Other Seated Lumbar Exercises  alternate hip flexion 20x      Lumbar Exercises: Supine   Ab Set  10 reps;5 reps   needed minimal VC   Bent Knee Raise  20 reps;1 second   abdominal bracing   Other Supine Lumbar Exercises  supine with pressing hands into the red phyoball to engage the abdominals   needs to assist Lt arm due to "torn ligaments"    Other Supine Lumbar Exercises  hookly ball squeeze with abdominal bracing holding 5 sec 10x  PT Short Term Goals - 08/26/18 1347      PT SHORT TERM GOAL #1   Title  be independent in initial HEP    Time  4    Period  Weeks    Status  Achieved    Target Date  07/16/18      PT SHORT TERM GOAL #2   Title  initiate a walking program to improve endurance for community function    Time  4    Period  Weeks    Status  On-going      PT SHORT TERM GOAL #3   Title  verbalize understanding of pain neuroscience education and initiate 1 strategy    Time  4    Period  Weeks    Status  Achieved    Target Date  07/16/18        PT Long Term Goals - 06/18/18 0959      PT LONG TERM GOAL #1   Title  be independent in advanced HEP    Time  8    Period  Weeks    Status  New    Target Date  08/13/18      PT LONG TERM GOAL #2   Title  reduce FOTO to < or = to 59% limitation    Time  8    Period  Weeks    Status  New    Target Date  08/13/18      PT LONG TERM GOAL #3   Title  perform regular walking for exercise > or = to 5 days a week    Time  8    Period  Weeks    Status  New    Target Date  08/13/18      PT LONG TERM GOAL #4   Title  report a 30% reduction in neck and lumbar pain with  sitting and walking    Time  8    Period  Weeks    Status  New    Target Date  08/13/18      PT LONG TERM GOAL #5   Title  demonstrate Rt and Lt cervical A/ROM rotation to > or = to 75 degrees to improve safety with driving    Time  8    Period  Weeks    Status  New    Target Date  08/13/18            Plan - 08/28/18 1351    Clinical Impression Statement  Focus of session today was to improve pt mobility, endurance and strength.  Pt with steady pain level of 7/10 in neck and lumbar spine.  Pt requires close supervision with exercise to monitor for pain and symptoms.  Pt didn't experience increased pain with exercise today.  Pt with chronic and widespread pain and obesity which limits ability to participate in exercise.  Pt will have aquatic therapy treatment in 2 weeks.  Pt will continue to benefit from skilled PT for strength, flexibility and endurance for functional tasks.    PT Frequency  2x / week    PT Duration  8 weeks    PT Treatment/Interventions  ADLs/Self Care Home Management;Cryotherapy;Electrical Stimulation;Moist Heat;Therapeutic exercise;Therapeutic activities;Functional mobility training;Neuromuscular re-education;Patient/family education;Manual techniques;Passive range of motion;Dry needling;Taping;Spinal Manipulations;Aquatic Therapy    PT Next Visit Plan  gentle flexibilty, trunk isometrics to engage core; added aquatic therapy starting on 09/12/18; change positions with exercise; discuss walking program    PT Home Exercise Plan  Access Code: [email or web address]    Consulted and Agree with Plan of Care  Patient       Patient will benefit from skilled therapeutic intervention in order to improve the following deficits and impairments:  Pain, Improper body mechanics, Postural dysfunction, Increased muscle spasms, Decreased activity tolerance, Decreased endurance, Decreased range of motion, Decreased strength, Impaired flexibility  Visit Diagnosis: 1. Chronic bilateral low back  pain, unspecified whether sciatica present   2. Cervicalgia   3. Muscle weakness (generalized)        Problem List Patient Active Problem List   Diagnosis Date Noted  . Anxiety 12/25/2017    Lorrene ReidKelly Takacs, PT 08/28/18 2:26 PM  [facility] [address], [address] [place], [place], [address] Phone: [phone]   Fax:  [phone]  Name: Nathaniel Grant MRN: [id number] Date of Birth: 1960/03/14

## 2018-09-02 DIAGNOSIS — G8929 Other chronic pain: Secondary | ICD-10-CM | POA: Insufficient documentation

## 2018-09-02 DIAGNOSIS — M542 Cervicalgia: Secondary | ICD-10-CM | POA: Insufficient documentation

## 2018-09-04 ENCOUNTER — Ambulatory Visit: Payer: Medicaid Other

## 2018-09-04 ENCOUNTER — Other Ambulatory Visit: Payer: Self-pay

## 2018-09-04 DIAGNOSIS — G8929 Other chronic pain: Secondary | ICD-10-CM

## 2018-09-04 DIAGNOSIS — M6281 Muscle weakness (generalized): Secondary | ICD-10-CM

## 2018-09-04 DIAGNOSIS — M542 Cervicalgia: Secondary | ICD-10-CM | POA: Diagnosis not present

## 2018-09-04 DIAGNOSIS — M545 Low back pain, unspecified: Secondary | ICD-10-CM

## 2018-09-04 NOTE — Therapy (Signed)
[facility] [address] [place], [place], [address] Phone: [phone]   Fax:  [phone]  Physical Therapy Treatment  Patient Details  Name: Nathaniel Grant MRN: [id number] Date of Birth: 10-Aug-1960 Referring Provider (PT): Rodell Perna, MD   Encounter Date: 09/04/2018  PT End of Session - 09/04/18 1355    Date for PT Re-Evaluation  09/25/18    Authorization Type  Medicaid; sent new auth on 7/6    Authorization - Visit Number  4    Authorization - Number of Visits  12    PT Start Time  2119    PT Stop Time  1410    PT Time Calculation (min)  38 min    Activity Tolerance  Patient tolerated treatment well;No increased pain    Behavior During Therapy  WFL for tasks assessed/performed       Past Medical History:  Diagnosis Date  . Depression     History reviewed. No pertinent surgical history.  There were no vitals filed for this visit.  Subjective Assessment - 09/04/18 1336    Subjective  I got my medication and I feel so much better now.    Patient Stated Goals  reduce pain, sit/stand/walk longer    Currently in Pain?  Yes    Pain Score  7     Pain Location  Back    Pain Orientation  Left;Right    Pain Descriptors / Indicators  Aching;Tightness    Pain Type  Chronic pain    Pain Onset  More than a month ago    Pain Frequency  Constant    Aggravating Factors   standing, washing dishes, walking    Pain Relieving Factors  naproxen         [facility] PT Assessment - 09/04/18 0001      Observation/Other Assessments   Focus on Therapeutic Outcomes (FOTO)   74% limitation                   [facility] Adult PT Treatment/Exercise - 09/04/18 0001      Lumbar Exercises: Stretches   Lower Trunk Rotation  4 reps;10 seconds      Lumbar Exercises: Aerobic   Nustep  level 2, seat #11, arm #11, for 10 minutes- Program 5   did at an even pace; Profile 5     Lumbar Exercises: Seated   Other Seated Lumbar  Exercises  press into foam roll for abdominal activation 5 second hold x20    Other Seated Lumbar Exercises  row with red band: 2x10      Lumbar Exercises: Supine   Ab Set  10 reps;5 reps   needed minimal VC   AB Set Limitations  with shoulder press    Bent Knee Raise  20 reps;1 second   abdominal bracing   Other Supine Lumbar Exercises  supine with pressing hands into the red phyoball to engage the abdominals   needs to assist Lt arm due to "torn ligaments"    Other Supine Lumbar Exercises  hookly ball squeeze with abdominal bracing holding 5 sec 10x      Knee/Hip Exercises: Standing   Other Standing Knee Exercises  standing pelvic tilt hold 5 sec to work on mobility and core strength;                PT Short Term Goals - 09/04/18 1337      PT SHORT TERM GOAL #1   Title  be  independent in initial HEP    Status  Achieved      PT SHORT TERM GOAL #2   Title  initiate a walking program to improve endurance for community function    Time  4    Period  Weeks    Status  On-going      PT SHORT TERM GOAL #3   Title  verbalize understanding of pain neuroscience education and initiate 1 strategy    Status  Achieved        PT Long Term Goals - 09/04/18 1337      PT LONG TERM GOAL #1   Title  be independent in advanced HEP    Time  8    Period  Weeks    Status  On-going      PT LONG TERM GOAL #3   Title  perform regular walking for exercise > or = to 5 days a week    Baseline  too hot outside    Time  8    Period  Weeks    Status  On-going      PT LONG TERM GOAL #4   Title  report a 30% reduction in neck and lumbar pain with sitting and walking    Time  8    Period  Weeks    Status  On-going            Plan - 09/04/18 1345    Clinical Impression Statement  Pt was able to take a new medication (Soma) and reports feeling less tension and stiffness although pain levels are the same. Pt has not been able to walk due to heat outside right now.  Focus of  session today was to improve pt mobility, endurance and strength.  Pt with chronic and widespread pain and obesity which limits ability to participate in exercise.  Pt will begin aquatic therapy treatment in 1 week.  Pt will benefit from skilled PT to address chronic pain, weakness and immobility.    Rehab Potential  Good    PT Frequency  2x / week    PT Duration  8 weeks    PT Treatment/Interventions  ADLs/Self Care Home Management;Cryotherapy;Electrical Stimulation;Moist Heat;Therapeutic exercise;Therapeutic activities;Functional mobility training;Neuromuscular re-education;Patient/family education;Manual techniques;Passive range of motion;Dry needling;Taping;Spinal Manipulations;Aquatic Therapy    PT Next Visit Plan  gentle flexibilty, trunk isometrics to engage core; added aquatic therapy starting on 09/12/18; change positions with exercise    PT Home Exercise Plan  Access Code: [email or web address]    Consulted and Agree with Plan of Care  Patient       Patient will benefit from skilled therapeutic intervention in order to improve the following deficits and impairments:  Pain, Improper body mechanics, Postural dysfunction, Increased muscle spasms, Decreased activity tolerance, Decreased endurance, Decreased range of motion, Decreased strength, Impaired flexibility  Visit Diagnosis: 1. Chronic bilateral low back pain, unspecified whether sciatica present   2. Cervicalgia   3. Muscle weakness (generalized)        Problem List Patient Active Problem List   Diagnosis Date Noted  . Anxiety 12/25/2017   Lorrene ReidKelly Lynise Porr, PT 09/04/18 2:18 PM   [facility] [address], [address] [place], [place], [address] Phone: [phone]   Fax:  [phone]  Name: Nathaniel Grant MRN: [id number] Date of Birth: [birth year]

## 2018-09-04 NOTE — Patient Instructions (Signed)
  Aquatic Therapy:  Keeping Everyone Safe!!!  We are so excited to be back in the pool for therapy and can't wait to see you in the water!! Having said that, we also want to make sure that we keep you, your family member, and everyone one else safe.  We have been in touch with our national aquatic association as well as the CDC to develop safe guidelines for aquatic therapy.  First, we want to assure you that the water is one of the safest places to be right now - chlorine and bromine kill the virus and the CDC states the virus cannot be transmitted in a pool; that's great news for [place]! Below are specific guidelines from the CDC that we will ask you and your caregiver to follow when coming to the [facility] for therapy. 1. Please shower AT HOME and come in your bathing suit and cover up to the pool. 2. Plan to leave in your suit and cover up and change at home - this decreases or eliminates time you will spend in the locker room. 3. Follow the Aquatic Center's guidelines to use bathroom facilities as needed (they will educate your caregiver on these guidelines as they enter the Center). 4. Your caregiver will be checked in and screened by the [facility] (there will be a table outside at the front door).  5. We will meet YOU at the door and escort you into the pool directly - you will not need to be screened by the Center.  We will screen as you enter with [place].  Your caregiver will join [place] pool side after check in. 6. Masks:  your caregiver must wear a mask at all times. The CDC recommends that patients and therapists wear their masks until we enter the water and then put them back on as we leave the water. PLEASE BRING A PLASTIC BAG TO STORE YOUR MASK IN WHILE WE ARE IN THE POOL.   Additional safety measures: 1. The [facility] will be practicing social distancing, wearing masks and implementing a stringent cleaning program.  2. We will only be using hard surface equipment and we  will be cleaning it between all patients. 3. We will be cleaning hand rails used to enter the pool and chairs that your caregiver might use. 4. We as employees of [employer] must complete a screening every day we work prior to our shift. 5. We are only offering aquatic therapy to patients who have been determined to be at low risk for the virus - we are happy to share that screen with you if you are interested.  We are excited to be able to offer aquatic therapy again in addition to services at our outpatient clinic - thank you for the opportunity to serve you!  Myrene Galas, PTA [phone] Leone Payor, PT

## 2018-09-10 ENCOUNTER — Other Ambulatory Visit: Payer: Self-pay

## 2018-09-10 ENCOUNTER — Ambulatory Visit: Payer: Medicaid Other | Attending: Orthopaedic Surgery

## 2018-09-10 DIAGNOSIS — M6281 Muscle weakness (generalized): Secondary | ICD-10-CM

## 2018-09-10 DIAGNOSIS — G8929 Other chronic pain: Secondary | ICD-10-CM | POA: Diagnosis present

## 2018-09-10 DIAGNOSIS — M542 Cervicalgia: Secondary | ICD-10-CM | POA: Diagnosis present

## 2018-09-10 DIAGNOSIS — M545 Low back pain: Secondary | ICD-10-CM | POA: Diagnosis not present

## 2018-09-10 NOTE — Therapy (Signed)
[facility] [address] [place], [place], [address] Phone: [phone]   Fax:  [phone]  Physical Therapy Treatment  Patient Details  Name: Nathaniel Grant MRN: [id number] Date of Birth: 1960/06/18 Referring Provider (PT): Rodell Perna, MD   Encounter Date: 09/10/2018  PT End of Session - 09/10/18 1306    Visit Number  9    Date for PT Re-Evaluation  09/25/18    Authorization Type  Medicaid; sent new auth on 7/6    Authorization Time Period  08/18/18-09/28/18    Authorization - Visit Number  5    Authorization - Number of Visits  12    PT Start Time  1228    PT Stop Time  9509    PT Time Calculation (min)  39 min    Activity Tolerance  Patient tolerated treatment well;No increased pain    Behavior During Therapy  WFL for tasks assessed/performed       Past Medical History:  Diagnosis Date  . Depression     History reviewed. No pertinent surgical history.  There were no vitals filed for this visit.  Subjective Assessment - 09/10/18 1230    Subjective  "The doctor is playing games with my medication,  I have not been able to get more Soma"    Pertinent History  chronic cervical and lumbar pain, diverticulitis, depression    Currently in Pain?  Yes    Pain Location  Back    Pain Orientation  Left;Right    Pain Descriptors / Indicators  Aching;Tightness    Pain Type  Chronic pain    Pain Onset  More than a month ago    Pain Frequency  Constant    Aggravating Factors   standing, washing dishes, walking    Pain Relieving Factors  Soma (not able to take it now)                       [facility] Adult PT Treatment/Exercise - 09/10/18 0001      Exercises   Exercises  Knee/Hip      Lumbar Exercises: Stretches   Lower Trunk Rotation  4 reps;10 seconds      Lumbar Exercises: Aerobic   Nustep  level 2, seat #11, arm #11, for 10 minutes- Program 5   did at an even pace; Profile 5     Lumbar  Exercises: Seated   Other Seated Lumbar Exercises  press into foam roll for abdominal activation 5 second hold x20    Other Seated Lumbar Exercises  row with red band: 2x10      Lumbar Exercises: Supine   Ab Set  10 reps;5 reps   needed minimal VC   AB Set Limitations  with shoulder press    Other Supine Lumbar Exercises  hookly ball squeeze with abdominal bracing holding 5 sec 10x      Knee/Hip Exercises: Standing   Other Standing Knee Exercises  standing pelvic tilt hold 5 sec to work on mobility and core strength;     Other Standing Knee Exercises  shoulder extension with bracing: red band 2x10      Knee/Hip Exercises: Seated   Marching  Strengthening;Both;2 sets;10 reps               PT Short Term Goals - 09/04/18 1337      PT SHORT TERM GOAL #1   Title  be independent in initial HEP    Status  Achieved      PT SHORT TERM GOAL #2   Title  initiate a walking program to improve endurance for community function    Time  4    Period  Weeks    Status  On-going      PT SHORT TERM GOAL #3   Title  verbalize understanding of pain neuroscience education and initiate 1 strategy    Status  Achieved        PT Long Term Goals - 09/04/18 1337      PT LONG TERM GOAL #1   Title  be independent in advanced HEP    Time  8    Period  Weeks    Status  On-going      PT LONG TERM GOAL #3   Title  perform regular walking for exercise > or = to 5 days a week    Baseline  too hot outside    Time  8    Period  Weeks    Status  On-going      PT LONG TERM GOAL #4   Title  report a 30% reduction in neck and lumbar pain with sitting and walking    Time  8    Period  Weeks    Status  On-going            Plan - 09/10/18 1240    Clinical Impression Statement  Pt arrived today upset that MD didn't renew his medication (SOMA).  Pt reports that not taking SOMA and needing to take Flexeril is making him tired and moody.  Pt was able to participate in low level exercise today  without limitation.  Pt with chronic and widespread pain and obesity which limits ability to participate in exercise.  Pt will begin aquatic therapy this week.    Rehab Potential  Good    PT Frequency  2x / week    PT Duration  8 weeks    PT Treatment/Interventions  ADLs/Self Care Home Management;Cryotherapy;Electrical Stimulation;Moist Heat;Therapeutic exercise;Therapeutic activities;Functional mobility training;Neuromuscular re-education;Patient/family education;Manual techniques;Passive range of motion;Dry needling;Taping;Spinal Manipulations;Aquatic Therapy    PT Next Visit Plan  Pt will begin aquatic therapy this week, alternate positions with exercise    PT Home Exercise Plan  Access Code: [email or web address]    Consulted and Agree with Plan of Care  Patient       Patient will benefit from skilled therapeutic intervention in order to improve the following deficits and impairments:  Pain, Improper body mechanics, Postural dysfunction, Increased muscle spasms, Decreased activity tolerance, Decreased endurance, Decreased range of motion, Decreased strength, Impaired flexibility  Visit Diagnosis: 1. Chronic bilateral low back pain, unspecified whether sciatica present   2. Cervicalgia   3. Muscle weakness (generalized)        Problem List Patient Active Problem List   Diagnosis Date Noted  . Anxiety 12/25/2017     Lorrene ReidKelly Quashawn Jewkes, PT 09/10/18 1:10 PM  [facility] [address], [address] [place], [place], [address] Phone: [phone]   Fax:  [phone]  Name: [name] MRN: [id number] Date of Birth: 17-Jun-1960

## 2018-09-12 ENCOUNTER — Encounter: Payer: Self-pay | Admitting: Physical Therapy

## 2018-09-12 ENCOUNTER — Other Ambulatory Visit: Payer: Self-pay

## 2018-09-12 ENCOUNTER — Ambulatory Visit: Payer: Medicaid Other | Admitting: Physical Therapy

## 2018-09-12 DIAGNOSIS — M542 Cervicalgia: Secondary | ICD-10-CM

## 2018-09-12 DIAGNOSIS — M6281 Muscle weakness (generalized): Secondary | ICD-10-CM

## 2018-09-12 DIAGNOSIS — G8929 Other chronic pain: Secondary | ICD-10-CM

## 2018-09-12 DIAGNOSIS — M545 Low back pain: Secondary | ICD-10-CM | POA: Diagnosis not present

## 2018-09-12 NOTE — Therapy (Signed)
[facility] [address], [place], [place], [address] Phone: [phone]   Fax:  [phone]  Physical Therapy Treatment  Patient Details  Name: Nathaniel Grant MRN: [id number] Date of Birth: 07/22/1960 Referring Provider (PT): Rodell Perna, MD   Encounter Date: 09/12/2018  PT End of Session - 09/12/18 1612    Visit Number  10    Date for PT Re-Evaluation  09/25/18    Authorization Type  Medicaid; sent new auth on 7/6    Authorization Time Period  08/18/18-09/28/18    Authorization - Visit Number  6    Authorization - Number of Visits  12    PT Start Time  1300    PT Stop Time  0737    PT Time Calculation (min)  45 min    Activity Tolerance  Patient tolerated treatment well;No increased pain    Behavior During Therapy  WFL for tasks assessed/performed       Past Medical History:  Diagnosis Date  . Depression     History reviewed. No pertinent surgical history.  There were no vitals filed for this visit.  Subjective Assessment - 09/12/18 1610    Subjective  I got my medication straightened out. I feel less pain today because I have my medication.    Currently in Pain?  Yes    Pain Score  5     Pain Location  Back   Back and neck intermittently   Pain Orientation  Lower    Pain Descriptors / Indicators  Aching;Tightness      Water temperature: 84.7 degrees Pt entered and exited the pool via long ramp using bilateral handles.  Seated exercises: Ankle circles, pumps 20x Knee flexion/extension 10x with concurrent education on how to use the principles of buoyancy, viscosity, and current to either support and assist or provide resistance. Pt verbally understood concepts.  Waist deep walking: 2x forward, 2x backwards emphasizing heel /toe gait.  Decompressive float using 2 small noodles, 2 medium size noodles and aqua jogger posteriorly worn to provide spinal traction when pt complained of pain or used In between  exercises. 2 min each segment.  Standing hip abduction with posteriorly worn aqua jogger 10x bil. Gentle trunk rotations 10x bil with aqua jogger. Small noodle presses for core contraction. Hold 3 sec 10x  End session with decompression float x 3 min as described above.                             PT Short Term Goals - 09/04/18 1337      PT SHORT TERM GOAL #1   Title  be independent in initial HEP    Status  Achieved      PT SHORT TERM GOAL #2   Title  initiate a walking program to improve endurance for community function    Time  4    Period  Weeks    Status  On-going      PT SHORT TERM GOAL #3   Title  verbalize understanding of pain neuroscience education and initiate 1 strategy    Status  Achieved        PT Long Term Goals - 09/04/18 1337      PT LONG TERM GOAL #1   Title  be independent in advanced HEP    Time  8    Period  Weeks    Status  On-going  PT LONG TERM GOAL #3   Title  perform regular walking for exercise > or = to 5 days a week    Baseline  too hot outside    Time  8    Period  Weeks    Status  On-going      PT LONG TERM GOAL #4   Title  report a 30% reduction in neck and lumbar pain with sitting and walking    Time  8    Period  Weeks    Status  On-going            Plan - 09/12/18 1613    Clinical Impression Statement  Pt verbally stated numerous times how much better he feels "in the water." he was able to ambulate heel/toe in waist deep water and performed both LE and core strengthening and flexibility exercises with mild complaints of pain, decompression float in between exercises would abolish the pain. Pt reports using the property of buoyancy really takes the weight off his joints.    Personal Factors and Comorbidities  Comorbidity 2    Comorbidities  obesity, chronic low back and cervical pain    Examination-Activity Limitations  Dressing;Sit;Stand;Stairs;Hygiene/Grooming;Bathing;Squat     Examination-Participation Restrictions  Community Activity;Meal Prep;Driving    Stability/Clinical Decision Making  Evolving/Moderate complexity    Rehab Potential  Good    PT Frequency  2x / week    PT Duration  8 weeks    PT Treatment/Interventions  ADLs/Self Care Home Management;Cryotherapy;Electrical Stimulation;Moist Heat;Therapeutic exercise;Therapeutic activities;Functional mobility training;Neuromuscular re-education;Patient/family education;Manual techniques;Passive range of motion;Dry needling;Taping;Spinal Manipulations;Aquatic Therapy    PT Next Visit Plan  Assess how pt felt after aquatic therapy.    PT Home Exercise Plan  Access Code: [email or web address]    Consulted and Agree with Plan of Care  Patient       Patient will benefit from skilled therapeutic intervention in order to improve the following deficits and impairments:  Pain, Improper body mechanics, Postural dysfunction, Increased muscle spasms, Decreased activity tolerance, Decreased endurance, Decreased range of motion, Decreased strength, Impaired flexibility  Visit Diagnosis: 1. Chronic bilateral low back pain, unspecified whether sciatica present   2. Cervicalgia   3. Muscle weakness (generalized)        Problem List Patient Active Problem List   Diagnosis Date Noted  . Anxiety 12/25/2017    Marketta Valadez, PTA 09/12/2018, 4:32 PM  [facility] [address], [address] [place], [place], [address] Phone: [phone]   Fax:  [phone]  Name: Nathaniel Grant MRN: [id number] Date of Birth: [birth year]

## 2018-09-17 ENCOUNTER — Other Ambulatory Visit: Payer: Self-pay

## 2018-09-17 ENCOUNTER — Ambulatory Visit: Payer: Medicaid Other

## 2018-09-17 DIAGNOSIS — G8929 Other chronic pain: Secondary | ICD-10-CM

## 2018-09-17 DIAGNOSIS — M545 Low back pain: Secondary | ICD-10-CM | POA: Diagnosis not present

## 2018-09-17 DIAGNOSIS — M542 Cervicalgia: Secondary | ICD-10-CM

## 2018-09-17 DIAGNOSIS — M6281 Muscle weakness (generalized): Secondary | ICD-10-CM

## 2018-09-17 NOTE — Therapy (Signed)
[facility] [address] [place], [place], [address] Phone: [phone]   Fax:  [phone]  Physical Therapy Treatment  Patient Details  Name: Nathaniel Grant MRN: [id number] Date of Birth: Oct 11, 1960 Referring Provider (PT): Rodell Perna, MD   Encounter Date: 09/17/2018  PT End of Session - 09/17/18 1301    Visit Number  11    Date for PT Re-Evaluation  09/25/18    Authorization Type  Medicaid; sent new auth on 7/6    Authorization Time Period  08/18/18-09/28/18    Authorization - Visit Number  7    Authorization - Number of Visits  12    PT Start Time  4235    PT Stop Time  1311    PT Time Calculation (min)  40 min    Activity Tolerance  Patient tolerated treatment well;No increased pain    Behavior During Therapy  WFL for tasks assessed/performed       Past Medical History:  Diagnosis Date  . Depression     History reviewed. No pertinent surgical history.  There were no vitals filed for this visit.  Subjective Assessment - 09/17/18 1234    Subjective  I got my Soma medication now.    Currently in Pain?  Yes    Pain Score  8     Pain Location  Back    Pain Descriptors / Indicators  Aching;Tightness    Pain Type  Chronic pain    Pain Onset  More than a month ago    Pain Frequency  Constant    Aggravating Factors   standing, walking, dishes    Pain Relieving Factors  Soma, water therapy                       [facility] Adult PT Treatment/Exercise - 09/17/18 0001      Exercises   Exercises  Knee/Hip;Lumbar      Lumbar Exercises: Aerobic   Nustep  level 5, seat #11, arm #11, for 10 minutes- Program 5   did at an even pace; Profile 5     Lumbar Exercises: Standing   Other Standing Lumbar Exercises  walking in reverse: 15# x 15      Lumbar Exercises: Seated   Sit to Stand Limitations  seated trunk rotation 4x10 seconds bil each    Other Seated Lumbar Exercises  press into foam roll for  abdominal activation 5 second hold x20    Other Seated Lumbar Exercises  row with red band: 2x10      Knee/Hip Exercises: Standing   Other Standing Knee Exercises  shoulder extension with bracing: red band 2x10      Knee/Hip Exercises: Seated   Ball Squeeze  10x hold 5 sec with abdominal contraction    Marching  Strengthening;Both;2 sets;10 reps               PT Short Term Goals - 09/04/18 1337      PT SHORT TERM GOAL #1   Title  be independent in initial HEP    Status  Achieved      PT SHORT TERM GOAL #2   Title  initiate a walking program to improve endurance for community function    Time  4    Period  Weeks    Status  On-going      PT SHORT TERM GOAL #3   Title  verbalize understanding of pain neuroscience education and initiate 1 strategy  Status  Achieved        PT Long Term Goals - 09/04/18 1337      PT LONG TERM GOAL #1   Title  be independent in advanced HEP    Time  8    Period  Weeks    Status  On-going      PT LONG TERM GOAL #3   Title  perform regular walking for exercise > or = to 5 days a week    Baseline  too hot outside    Time  8    Period  Weeks    Status  On-going      PT LONG TERM GOAL #4   Title  report a 30% reduction in neck and lumbar pain with sitting and walking    Time  8    Period  Weeks    Status  On-going            Plan - 09/17/18 1249    Clinical Impression Statement  Pt has been able to return to SOMA medication and started aquatic PT this week.  Pt is concerned regarding weight gain and wants to be able to eat healthier and exercise more.  Pt was able to participate in low level exercise today without limitation. Pt advanced to walking in reverse with weights. Pt reports 8/10 pain today and didn't demonstrated painful movement patterns with exercise.   Pt with chronic and widespread pain and obesity which limits ability to participate in exercise.  Pt will continue to benefit from skilled PT for gentle  progression of land and water based exercise.    PT Frequency  2x / week    PT Duration  8 weeks    PT Treatment/Interventions  ADLs/Self Care Home Management;Cryotherapy;Electrical Stimulation;Moist Heat;Therapeutic exercise;Therapeutic activities;Functional mobility training;Neuromuscular re-education;Patient/family education;Manual techniques;Passive range of motion;Dry needling;Taping;Spinal Manipulations;Aquatic Therapy    PT Next Visit Plan  aquatic therapy, strength and mobilty    PT Home Exercise Plan  Access Code: [email or web address]    Consulted and Agree with Plan of Care  Patient       Patient will benefit from skilled therapeutic intervention in order to improve the following deficits and impairments:  Pain, Improper body mechanics, Postural dysfunction, Increased muscle spasms, Decreased activity tolerance, Decreased endurance, Decreased range of motion, Decreased strength, Impaired flexibility  Visit Diagnosis: 1. Cervicalgia   2. Muscle weakness (generalized)   3. Chronic bilateral low back pain, unspecified whether sciatica present        Problem List Patient Active Problem List   Diagnosis Date Noted  . Anxiety 12/25/2017    Nathaniel ReidKelly Katryna Grant, PT 09/17/18 1:11 PM   [facility] [address], [address] [place], [place], [address] Phone: [phone]   Fax:  [phone]  Name: Nathaniel SorensonRichard Grant MRN: [id number] Date of Birth: Apr 04, 1960

## 2018-09-19 ENCOUNTER — Other Ambulatory Visit: Payer: Self-pay

## 2018-09-19 ENCOUNTER — Ambulatory Visit: Payer: Medicaid Other | Admitting: Physical Therapy

## 2018-09-19 ENCOUNTER — Encounter: Payer: Self-pay | Admitting: Physical Therapy

## 2018-09-19 DIAGNOSIS — M545 Low back pain, unspecified: Secondary | ICD-10-CM

## 2018-09-19 DIAGNOSIS — G8929 Other chronic pain: Secondary | ICD-10-CM

## 2018-09-19 DIAGNOSIS — M6281 Muscle weakness (generalized): Secondary | ICD-10-CM

## 2018-09-19 DIAGNOSIS — M542 Cervicalgia: Secondary | ICD-10-CM

## 2018-09-19 NOTE — Therapy (Signed)
[facility] [address], [address] [place], [place], [address] Phone: [phone]   Fax:  [phone]  Physical Therapy Treatment  Patient Details  Name: Nathaniel SorensonRichard Grant MRN: [id number] Date of Birth: 07-13-1960 Referring Provider (PT): Annell GreeningYates, Mark, MD   Encounter Date: 09/19/2018  PT End of Session - 09/19/18 1632    Visit Number  12    Date for PT Re-Evaluation  09/25/18    Authorization Type  Medicaid; sent new auth on 7/6    Authorization Time Period  08/18/18-09/28/18    Authorization - Visit Number  8    Authorization - Number of Visits  12    PT Start Time  1300    PT Stop Time  1345    PT Time Calculation (min)  45 min    Activity Tolerance  Patient tolerated treatment well    Behavior During Therapy  [facility] tasks assessed/performed       Past Medical History:  Diagnosis Date  . Depression     History reviewed. No pertinent surgical history.  There were no vitals filed for this visit.  Subjective Assessment - 09/19/18 1630    Subjective  I had sore muscles after the pool session but I think it was just from exercising. It was acceptable to me.    Currently in Pain?  No/denies   Due to taking his meds he reports   Multiple Pain Sites  No       Treatment: Water temp 84.7 degrees F Pt entered and exited the pool via long ramp with Bil handrails.  Pt ambulated in waist deep water 1 length with no extra assistance in each direction. Pt required 1 min decompression in between lengths to manage any back pain that started with his walking.   Large pool noodle submersions for core contractions: Hold 5 sec 10x Trunk rotation bil 10x submerged in water,  Shoulder rows 3x10 submerged in water, pt used this to "relax" his back.  Shoulder flex/ext with push/pull to create resistance 10x Standing hip abduction using buoyancy for abduction, resistance adduction bil 10x Standing hip circles 10x each direction  bil Standing heel lifts 10sx  Pt attempted breast stroke. Stopped about 1/2 way due to neck pain  5 min decompression float to end session with and without large noodles.                             PT Short Term Goals - 09/04/18 1337      PT SHORT TERM GOAL #1   Title  be independent in initial HEP    Status  Achieved      PT SHORT TERM GOAL #2   Title  initiate a walking program to improve endurance for community function    Time  4    Period  Weeks    Status  On-going      PT SHORT TERM GOAL #3   Title  verbalize understanding of pain neuroscience education and initiate 1 strategy    Status  Achieved        PT Long Term Goals - 09/04/18 1337      PT LONG TERM GOAL #1   Title  be independent in advanced HEP    Time  8    Period  Weeks    Status  On-going      PT LONG TERM GOAL #3   Title  perform regular walking  for exercise > or = to 5 days a week    Baseline  too hot outside    Time  8    Period  Weeks    Status  On-going      PT LONG TERM GOAL #4   Title  report a 30% reduction in neck and lumbar pain with sitting and walking    Time  8    Period  Weeks    Status  On-going            Plan - 09/19/18 1634    Clinical Impression Statement  Pt arrived to aquatic therapy today pain free which he attributes to his medication. He did not require any additional support via floatation devices today for his exercises. The buoyancy of the water was sufficient. Pt expressed he wanted to do aquatic therapy "for ever." PTA reminded pt that our goal was to [facility] get him independnt in an program that he could continue in the water but he did not understand why something that helps him so much would be discontinued. Pt attempted a full swimming stroke; breast stroke, but there was too much cervical extension increasing his neck pain.    Personal Factors and Comorbidities  Comorbidity 2    Comorbidities  obesity, chronic low back and cervical  pain    Examination-Activity Limitations  Dressing;Sit;Stand;Stairs;Hygiene/Grooming;Bathing;Squat    Examination-Participation Restrictions  Community Activity;Meal Prep;Driving    Stability/Clinical Decision Making  Evolving/Moderate complexity    Rehab Potential  Good    PT Frequency  2x / week    PT Duration  8 weeks    PT Treatment/Interventions  ADLs/Self Care Home Management;Cryotherapy;Electrical Stimulation;Moist Heat;Therapeutic exercise;Therapeutic activities;Functional mobility training;Neuromuscular re-education;Patient/family education;Manual techniques;Passive range of motion;Dry needling;Taping;Spinal Manipulations;Aquatic Therapy    PT Next Visit Plan  aquatic therapy, strength and mobilty    PT Home Exercise Plan  Access Code: [email or web address]    Consulted and Agree with Plan of Care  Patient       Patient will benefit from skilled therapeutic intervention in order to improve the following deficits and impairments:  Pain, Improper body mechanics, Postural dysfunction, Increased muscle spasms, Decreased activity tolerance, Decreased endurance, Decreased range of motion, Decreased strength, Impaired flexibility  Visit Diagnosis: 1. Cervicalgia   2. Muscle weakness (generalized)   3. Chronic bilateral low back pain, unspecified whether sciatica present        Problem List Patient Active Problem List   Diagnosis Date Noted  . Anxiety 12/25/2017    Evah Rashid, PTA 09/19/2018, 4:45 PM   [facility] [address], [place], [place], [address] Phone: [phone]   Fax:  [phone]  Name: Nathaniel Grant MRN: [id number] Date of Birth: 1960/06/11

## 2018-09-24 ENCOUNTER — Ambulatory Visit: Payer: Medicaid Other

## 2018-09-24 ENCOUNTER — Other Ambulatory Visit: Payer: Self-pay

## 2018-09-24 DIAGNOSIS — M545 Low back pain: Secondary | ICD-10-CM | POA: Diagnosis not present

## 2018-09-24 DIAGNOSIS — G8929 Other chronic pain: Secondary | ICD-10-CM

## 2018-09-24 DIAGNOSIS — M6281 Muscle weakness (generalized): Secondary | ICD-10-CM

## 2018-09-24 DIAGNOSIS — M542 Cervicalgia: Secondary | ICD-10-CM

## 2018-09-24 NOTE — Therapy (Signed)
[facility] [address], [address] [place], [place], [address] Phone: [phone]   Fax:  [phone]  Physical Therapy Treatment  Patient Details  Name: Nathaniel Grant MRN: [id number] Date of Birth: 1960-03-26 Referring Provider (PT): Annell GreeningYates, Mark, MD   Encounter Date: 09/24/2018  PT End of Session - 09/24/18 1325    Visit Number  13    Date for PT Re-Evaluation  11/05/18    Authorization Type  Medicaid; sent new auth on 09/24/18    Authorization Time Period  08/18/18-09/28/18    Authorization - Visit Number  9    Authorization - Number of Visits  12    PT Start Time  1251   late, only time for reassessment   PT Stop Time  1314    PT Time Calculation (min)  23 min    Activity Tolerance  Patient tolerated treatment well    Behavior During Therapy  [facility] for tasks assessed/performed       Past Medical History:  Diagnosis Date  . Depression     History reviewed. No pertinent surgical history.  There were no vitals filed for this visit.  Subjective Assessment - 09/24/18 1254    Subjective  I am feeling better with PT.  I am much more flexibile.  The pool therapy really helps.    Currently in Pain?  Yes    Pain Score  6     Pain Location  Back    Pain Orientation  Lower    Pain Descriptors / Indicators  Aching;Tightness    Pain Onset  More than a month ago    Pain Frequency  Constant    Aggravating Factors   standing, walking, sitting too long    Pain Relieving Factors  Soma, aquatic PT    Pain Score  6    Pain Location  Neck    Pain Orientation  Left;Right    Pain Descriptors / Indicators  Aching;Tightness    Pain Type  Chronic pain    Pain Onset  More than a month ago    Pain Frequency  Constant    Aggravating Factors   sitting at the computer, turning head    Pain Relieving Factors  change of position, Soma         [facility] PT Assessment - 09/24/18 0001      Assessment   Medical Diagnosis  chronic bilateral  low back pain, neck pain    Referring Provider (PT)  Annell GreeningYates, Mark, MD    Hand Dominance  Left    Next MD Visit  unknown      Home Environment   Living Environment  Private residence    Living Arrangements  Other relatives      Prior Function   Level of Independence  Independent    Vocation  Unemployed    Leisure  computer- Social workergame design, plays guitar      Cognition   Overall Cognitive Status  Within Functional Limits for tasks assessed      Observation/Other Assessments   Focus on Therapeutic Outcomes (FOTO)   60% limitation      Posture/Postural Control   Posture/Postural Control  Postural limitations    Postural Limitations  Forward head;Flexed trunk;Increased thoracic kyphosis      AROM   Overall AROM   Deficits    AROM Assessment Site  Cervical    Cervical - Right Rotation  75    Cervical - Left Rotation  75  Strength   Overall Strength  Deficits    Overall Strength Comments  Lt UE 4/5, Rt UE 4+/5, LEs: knees 4+/5, hips 4-/5                             PT Short Term Goals - 09/04/18 1337      PT SHORT TERM GOAL #1   Title  be independent in initial HEP    Status  Achieved      PT SHORT TERM GOAL #2   Title  initiate a walking program to improve endurance for community function    Time  4    Period  Weeks    Status  On-going      PT SHORT TERM GOAL #3   Title  verbalize understanding of pain neuroscience education and initiate 1 strategy    Status  Achieved        PT Long Term Goals - 09/24/18 1256      PT LONG TERM GOAL #1   Title  be independent in advanced HEP    Time  8    Period  Weeks    Status  On-going    Target Date  11/19/18      PT LONG TERM GOAL #2   Title  reduce FOTO to < or = to 59% limitation    Baseline  60% limitation    Time  6    Period  Weeks    Status  On-going      PT LONG TERM GOAL #3   Title  perform regular walking for exercise > or = to 5 days a week    Baseline  too hot outside, now able to  go to the pool    Time  6    Period  Weeks    Status  On-going    Target Date  11/19/18      PT LONG TERM GOAL #4   Title  report a 30% reduction in neck and lumbar pain with sitting and walking    Baseline  5-10%    Time  6    Period  Weeks    Status  On-going    Target Date  11/05/18      PT LONG TERM GOAL #5   Title  demonstrate Rt and Lt cervical A/ROM rotation to > or = to 75 degrees to improve safety with driving    Baseline  75 degrees bilaterally    Status  Achieved    Target Date  --      PT LONG TERM GOAL #6   Title  improve lumbar and Rt shoulder flexibility to allow for indendence with self-care (wiping and cleaning)    Time  6    Period  Weeks    Status  New    Target Date  11/05/18            Plan - 09/24/18 1324    Clinical Impression Statement  Pt is making slow progress due to chronic nature of his neck and cervical pain.  Pt reports 5-10% overall improvement in symptoms since the start of care.   Pt has initiated aquatic therapy to allow for exercise while unloading his joints.  Pt was able to perform exercises in the pool last week without additional flotation devices.  Pt has had challenges over the past month with alternation to his pain medication.  Pt is now on a medication  that helps his overall pain levels.  Pt reports increase ease with dressing and bathing and with cleaning/self-care since the start of care. FOTO is improved to 60% limitation and still indicates significant functional limitation.  Cervical A/ROM is improved to 75 degrees bilaterally and Lt UE is stronger.  Pt will continue to benefit from skilled PT to improve functional mobility, allow for community activity and improve independence with bathing and self-care tasks.    Examination-Activity Limitations  Dressing;Sit;Stand;Stairs;Hygiene/Grooming;Bathing;Squat    Examination-Participation Restrictions  Community Activity;Meal Prep;Driving    Rehab Potential  Good    PT Frequency  2x /  week    PT Duration  6 weeks    PT Treatment/Interventions  ADLs/Self Care Home Management;Cryotherapy;Electrical Stimulation;Moist Heat;Therapeutic exercise;Therapeutic activities;Functional mobility training;Neuromuscular re-education;Patient/family education;Manual techniques;Passive range of motion;Dry needling;Taping;Spinal Manipulations;Aquatic Therapy    PT Next Visit Plan  Aquatic PT this week.  Check on Medicaid visits.  Continue to address strength, mobility and flexibility.       Patient will benefit from skilled therapeutic intervention in order to improve the following deficits and impairments:  Pain, Improper body mechanics, Postural dysfunction, Increased muscle spasms, Decreased activity tolerance, Decreased endurance, Decreased range of motion, Decreased strength, Impaired flexibility  Visit Diagnosis: 1. Cervicalgia   2. Muscle weakness (generalized)   3. Chronic bilateral low back pain, unspecified whether sciatica present        Problem List Patient Active Problem List   Diagnosis Date Noted  . Anxiety 12/25/2017    Sigurd Sos, PT 09/24/18 1:34 PM  [facility] [address] [place], [place], [address] Phone: [phone]   Fax:  [phone]  Name: Nathaniel Grant MRN: [id number] Date of Birth: [birth year]

## 2018-09-26 ENCOUNTER — Encounter: Payer: Self-pay | Admitting: Physical Therapy

## 2018-09-26 ENCOUNTER — Ambulatory Visit: Payer: Medicaid Other | Admitting: Physical Therapy

## 2018-09-26 ENCOUNTER — Other Ambulatory Visit: Payer: Self-pay

## 2018-09-26 DIAGNOSIS — G8929 Other chronic pain: Secondary | ICD-10-CM

## 2018-09-26 DIAGNOSIS — M542 Cervicalgia: Secondary | ICD-10-CM

## 2018-09-26 DIAGNOSIS — M545 Low back pain: Secondary | ICD-10-CM | POA: Diagnosis not present

## 2018-09-26 DIAGNOSIS — M6281 Muscle weakness (generalized): Secondary | ICD-10-CM

## 2018-09-26 NOTE — Therapy (Signed)
[facility] [address], [place], [place], [address] Phone: [phone]   Fax:  [phone]  Physical Therapy Treatment  Patient Details  Name: Roberth Berling MRN: [id number] Date of Birth: March 17, 1960 Referring Provider (PT): Rodell Perna, MD   Encounter Date: 09/26/2018  PT End of Session - 09/26/18 1630    Visit Number  14    Date for PT Re-Evaluation  11/05/18    Authorization Type  Medicaid; sent new auth on 09/24/18    Authorization Time Period  08/18/18-09/28/18    Authorization - Visit Number  10    Authorization - Number of Visits  12    PT Start Time  1300    PT Stop Time  1345    PT Time Calculation (min)  45 min    Activity Tolerance  Patient tolerated treatment well    Behavior During Therapy  [facility] for tasks assessed/performed       Past Medical History:  Diagnosis Date  . Depression     History reviewed. No pertinent surgical history.  There were no vitals filed for this visit.  Subjective Assessment - 09/26/18 1628    Subjective  I am in a bad place today. 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 I am looking forward to being in the pool today.    Pertinent History  chronic cervical and lumbar pain, diverticulitis, depression    Limitations  Standing;Sitting;Walking    How long can you sit comfortably?  30 minutes max    How long can you stand comfortably?  very limited < 5 minutes    How long can you walk comfortably?  walking limited in the community- 10 minutes limitation    Diagnostic tests  Pt had x-ray: C5-6 and C6-7 facet arthropahty and spurring.  Lumbar spondylosis: L2-3, L5-S1.    Currently in Pain?  Yes    Pain Score  6     Pain Location  Back    Pain Orientation  Lower    Pain Descriptors / Indicators  Sore    Multiple Pain Sites  No       Aquatic Session: Water temp 84.7 degrees F Pt entered and exited the pool via long ramp  using bil hand rails.   Seated: Ankle circles 10x each direction, ankle PF/DF 10x each, knee flexion/extension 10x with push/pull of the water to increase resistance.  Water walking in waist deep to almost chest depth: Performed 4 lengths in each direction, no rest breaks.   Trunk rotation underwater 10x to pt's tolerance  Standing UE water weights/submerged for increased resistance. Shoulder flex/ext 10x each, shoulder abd/add 10x each,breast stroke arms 10x then reverse direction 10x.  Bicycle x 5 min with support of 2 pool noodles behind pt. Decompression float x 5 min to end session.                           PT Short Term Goals - 09/04/18 1337      PT SHORT TERM GOAL #1   Title  be independent in initial HEP    Status  Achieved      PT SHORT TERM GOAL #2   Title  initiate a walking program to improve endurance for community function    Time  4    Period  Weeks    Status  On-going  PT SHORT TERM GOAL #3   Title  verbalize understanding of pain neuroscience education and initiate 1 strategy    Status  Achieved        PT Long Term Goals - 09/24/18 1256      PT LONG TERM GOAL #1   Title  be independent in advanced HEP    Time  8    Period  Weeks    Status  On-going    Target Date  11/19/18      PT LONG TERM GOAL #2   Title  reduce FOTO to < or = to 59% limitation    Baseline  60% limitation    Time  6    Period  Weeks    Status  On-going      PT LONG TERM GOAL #3   Title  perform regular walking for exercise > or = to 5 days a week    Baseline  too hot outside, now able to go to the pool    Time  6    Period  Weeks    Status  On-going    Target Date  11/19/18      PT LONG TERM GOAL #4   Title  report a 30% reduction in neck and lumbar pain with sitting and walking    Baseline  5-10%    Time  6    Period  Weeks    Status  On-going    Target Date  11/05/18      PT LONG TERM GOAL #5   Title  demonstrate Rt and Lt cervical  A/ROM rotation to > or = to 75 degrees to improve safety with driving    Baseline  75 degrees bilaterally    Status  Achieved    Target Date  --      PT LONG TERM GOAL #6   Title  improve lumbar and Rt shoulder flexibility to allow for indendence with self-care (wiping and cleaning)    Time  6    Period  Weeks    Status  New    Target Date  11/05/18            Plan - 09/26/18 1631    Clinical Impression Statement  Pt demonstrates improved endurance today for aquatic therapy. Pt was able to walk more lengths of the pool before getting tired and needing a rest. He also was able to add water weights for postural strength and endurance utilizing both the resistance from the water AND the weights. No pain was reported with any of his new exercises. Pt was also able to perform 5 min of water cycling with the support of 2 pool noodles.    Personal Factors and Comorbidities  Comorbidity 2    Comorbidities  obesity, chronic low back and cervical pain    Examination-Activity Limitations  Dressing;Sit;Stand;Stairs;Hygiene/Grooming;Bathing;Squat    Examination-Participation Restrictions  Community Activity;Meal Prep;Driving    Stability/Clinical Decision Making  Evolving/Moderate complexity    Rehab Potential  Good    PT Frequency  2x / week    PT Duration  6 weeks    PT Treatment/Interventions  ADLs/Self Care Home Management;Cryotherapy;Electrical Stimulation;Moist Heat;Therapeutic exercise;Therapeutic activities;Functional mobility training;Neuromuscular re-education;Patient/family education;Manual techniques;Passive range of motion;Dry needling;Taping;Spinal Manipulations;Aquatic Therapy    PT Next Visit Plan  Check on Medicaid and continue with progression of strength, mobility, and flexibility per POC.    PT Home Exercise Plan  Access Code: [email or web address]    Consulted and Agree with Plan  of Care  Patient       Patient will benefit from skilled therapeutic intervention in order to improve the  following deficits and impairments:  Pain, Improper body mechanics, Postural dysfunction, Increased muscle spasms, Decreased activity tolerance, Decreased endurance, Decreased range of motion, Decreased strength, Impaired flexibility  Visit Diagnosis: Cervicalgia  Muscle weakness (generalized)  Chronic bilateral low back pain, unspecified whether sciatica present     Problem List Patient Active Problem List   Diagnosis Date Noted  . Anxiety 12/25/2017    Trumaine Wimer, PTA 09/26/2018, 4:38 PM  [facility] [address], [address] [place], [place], [address] Phone: [phone]   Fax:  [phone]  Name: Laverda SorensonRichard Mcchesney MRN: [id number] Date of Birth: [birth year]

## 2018-10-02 ENCOUNTER — Ambulatory Visit: Payer: Medicaid Other

## 2018-10-02 ENCOUNTER — Other Ambulatory Visit: Payer: Self-pay

## 2018-10-02 DIAGNOSIS — M545 Low back pain: Secondary | ICD-10-CM | POA: Diagnosis not present

## 2018-10-02 DIAGNOSIS — G8929 Other chronic pain: Secondary | ICD-10-CM

## 2018-10-02 DIAGNOSIS — M542 Cervicalgia: Secondary | ICD-10-CM

## 2018-10-02 DIAGNOSIS — M6281 Muscle weakness (generalized): Secondary | ICD-10-CM

## 2018-10-02 NOTE — Therapy (Signed)
[facility] [address], [place], [place], [address] Phone: [phone]   Fax:  [phone]  Physical Therapy Treatment  Patient Details  Name: Tara Wich MRN: [id number] Date of Birth: 12-17-1960 Referring Provider (PT): Rodell Perna, MD   Encounter Date: 10/02/2018  PT End of Session - 10/02/18 1309    Visit Number  15    Date for PT Re-Evaluation  11/05/18    Authorization - Visit Number  1    Authorization - Number of Visits  12    PT Start Time  7341    PT Stop Time  1309    PT Time Calculation (min)  35 min    Activity Tolerance  Patient tolerated treatment well    Behavior During Therapy  [facility] for tasks assessed/performed       Past Medical History:  Diagnosis Date  . Depression     History reviewed. No pertinent surgical history.  There were no vitals filed for this visit.  Subjective Assessment - 10/02/18 1219    Subjective  I did well in the pool    Currently in Pain?  Yes    Pain Score  7     Pain Location  Back    Pain Orientation  Left    Pain Descriptors / Indicators  Sore    Pain Type  Chronic pain    Pain Onset  More than a month ago    Pain Frequency  Constant    Aggravating Factors   standing, walking, sitting too long    Pain Relieving Factors  Soma, aquatic therapy    Pain Score  8    Pain Location  Neck    Pain Orientation  Right;Left    Pain Descriptors / Indicators  Aching;Tightness    Pain Type  Chronic pain    Pain Onset  More than a month ago    Pain Frequency  Constant    Aggravating Factors   turning head, sleeping too much    Pain Relieving Factors  change of position, Soma                       [facility] Adult PT Treatment/Exercise - 10/02/18 0001      Lumbar Exercises: Aerobic   Nustep  level 5, seat #11, arm #11, for 10 minutes- Program 5   did at an even pace; Profile 5     Lumbar Exercises: Seated   Sit to Stand Limitations  red ball press out  from chest: 2x10    Other Seated Lumbar Exercises  press into foam roll for abdominal activation 5 second hold x20    Other Seated Lumbar Exercises  row with red band: 2x10      Knee/Hip Exercises: Standing   Hip Abduction  Stengthening;Both;2 sets;5 sets    Hip Extension  Stengthening;Both;2 sets;5 sets      Knee/Hip Exercises: Seated   Ball Squeeze  10x hold 5 sec with abdominal contraction    Marching  Strengthening;Both;2 sets;10 reps               PT Short Term Goals - 09/04/18 1337      PT SHORT TERM GOAL #1   Title  be independent in initial HEP    Status  Achieved      PT SHORT TERM GOAL #2   Title  initiate a walking program to improve endurance for community function    Time  4    Period  Weeks    Status  On-going      PT SHORT TERM GOAL #3   Title  verbalize understanding of pain neuroscience education and initiate 1 strategy    Status  Achieved        PT Long Term Goals - 09/24/18 1256      PT LONG TERM GOAL #1   Title  be independent in advanced HEP    Time  8    Period  Weeks    Status  On-going    Target Date  11/19/18      PT LONG TERM GOAL #2   Title  reduce FOTO to < or = to 59% limitation    Baseline  60% limitation    Time  6    Period  Weeks    Status  On-going      PT LONG TERM GOAL #3   Title  perform regular walking for exercise > or = to 5 days a week    Baseline  too hot outside, now able to go to the pool    Time  6    Period  Weeks    Status  On-going    Target Date  11/19/18      PT LONG TERM GOAL #4   Title  report a 30% reduction in neck and lumbar pain with sitting and walking    Baseline  5-10%    Time  6    Period  Weeks    Status  On-going    Target Date  11/05/18      PT LONG TERM GOAL #5   Title  demonstrate Rt and Lt cervical A/ROM rotation to > or = to 75 degrees to improve safety with driving    Baseline  75 degrees bilaterally    Status  Achieved    Target Date  --      PT LONG TERM GOAL #6    Title  improve lumbar and Rt shoulder flexibility to allow for indendence with self-care (wiping and cleaning)    Time  6    Period  Weeks    Status  New    Target Date  11/05/18            Plan - 10/02/18 1252    Clinical Impression Statement  Pt is making slow progress due to chronic nature of his neck and cervical pain.  Pt reports 5-10% overall improvement in symptoms since the start of care.    Pt has had challenges over the past month with alternation to his pain medication.  Pt is now on a medication that helps his overall pain levels.  Pt reports increase ease with dressing and bathing and with cleaning/self-care since the start of care. PT challenged pt to do more standing exercise today as his complaint is difficulty with standing to cook a meal.  Pt reports being depressed and anxious and PT discussed strategies to reduce this.  Pt will continue to benefit from skilled PT to improve functional mobility, allow for community activity and improve independence with bathing and self-care tasks.    PT Treatment/Interventions  ADLs/Self Care Home Management;Cryotherapy;Electrical Stimulation;Moist Heat;Therapeutic exercise;Therapeutic activities;Functional mobility training;Neuromuscular re-education;Patient/family education;Manual techniques;Passive range of motion;Dry needling;Taping;Spinal Manipulations;Aquatic Therapy    PT Next Visit Plan  continue with progression of strength, mobility, and flexibility per POC.    PT Home Exercise Plan  Access Code: [email or web address]    Consulted and Agree with  Plan of Care  Patient       Patient will benefit from skilled therapeutic intervention in order to improve the following deficits and impairments:  Pain, Improper body mechanics, Postural dysfunction, Increased muscle spasms, Decreased activity tolerance, Decreased endurance, Decreased range of motion, Decreased strength, Impaired flexibility  Visit Diagnosis: Muscle weakness  (generalized)  Cervicalgia  Chronic bilateral low back pain, unspecified whether sciatica present     Problem List Patient Active Problem List   Diagnosis Date Noted  . Anxiety 12/25/2017    Lorrene Reid, PT 10/02/18 1:13 PM  [facility] [address], [address] [place], [place], [address] Phone: [phone]   Fax:  [phone]  Name: Easton Pfuhl MRN: [id number] Date of Birth: 1960-08-06

## 2018-10-08 ENCOUNTER — Ambulatory Visit: Payer: Medicaid Other | Attending: Orthopaedic Surgery

## 2018-10-08 ENCOUNTER — Other Ambulatory Visit: Payer: Self-pay

## 2018-10-08 DIAGNOSIS — M6281 Muscle weakness (generalized): Secondary | ICD-10-CM | POA: Diagnosis present

## 2018-10-08 DIAGNOSIS — G8929 Other chronic pain: Secondary | ICD-10-CM | POA: Insufficient documentation

## 2018-10-08 DIAGNOSIS — M545 Low back pain, unspecified: Secondary | ICD-10-CM

## 2018-10-08 DIAGNOSIS — M542 Cervicalgia: Secondary | ICD-10-CM | POA: Diagnosis present

## 2018-10-08 NOTE — Therapy (Signed)
[facility] [address] [place], [place], [address] Phone: [phone]   Fax:  [phone]  Physical Therapy Treatment  Patient Details  Name: Nathaniel Grant MRN: [id number] Date of Birth: April 29, 1960 Referring Provider (PT): Rodell Perna, MD   Encounter Date: 10/08/2018  PT End of Session - 10/08/18 1303    Visit Number  16    Date for PT Re-Evaluation  11/05/18    Authorization Type  12 visits 8/24-10/05/2018    Authorization - Visit Number  2    Authorization - Number of Visits  12    PT Start Time  8242    PT Stop Time  1313    PT Time Calculation (min)  39 min    Activity Tolerance  Patient tolerated treatment well    Behavior During Therapy  [facility] for tasks assessed/performed       Past Medical History:  Diagnosis Date  . Depression     History reviewed. No pertinent surgical history.  There were no vitals filed for this visit.  Subjective Assessment - 10/08/18 1236    Subjective  I didnt have the pool last week.  I have still been sleeping a lot.    Patient Stated Goals  reduce pain, sit/stand/walk longer    Currently in Pain?  Yes    Pain Score  7     Pain Location  Back    Pain Orientation  Left    Pain Descriptors / Indicators  Sore;Aching    Pain Type  Chronic pain    Pain Onset  More than a month ago    Pain Frequency  Constant    Aggravating Factors   standing, walking, sitting too long    Pain Relieving Factors  Soma, aquatic therapy                       [facility] Adult PT Treatment/Exercise - 10/08/18 0001      Lumbar Exercises: Aerobic   Nustep  level 5, seat #11, arm #11, for 10 minutes- Program 5   did at an even pace; Profile 5     Lumbar Exercises: Standing   Shoulder Extension  Power Tower;Both;20 reps    Shoulder Extension Limitations  15# 2x10 with abdominal bracing    Other Standing Lumbar Exercises  walking in reverse: 10# x 15   cues for abdominal bracing x10      Lumbar Exercises: Seated   Sit to Stand Limitations  blue ball rolls forward and diagonals x 10 each    Other Seated Lumbar Exercises  press into foam roll for abdominal activation 5 second hold x20    Other Seated Lumbar Exercises  row with red band: 2x10               PT Short Term Goals - 09/04/18 1337      PT SHORT TERM GOAL #1   Title  be independent in initial HEP    Status  Achieved      PT SHORT TERM GOAL #2   Title  initiate a walking program to improve endurance for community function    Time  4    Period  Weeks    Status  On-going      PT SHORT TERM GOAL #3   Title  verbalize understanding of pain neuroscience education and initiate 1 strategy    Status  Achieved        PT Long Term  Goals - 09/24/18 1256      PT LONG TERM GOAL #1   Title  be independent in advanced HEP    Time  8    Period  Weeks    Status  On-going    Target Date  11/19/18      PT LONG TERM GOAL #2   Title  reduce FOTO to < or = to 59% limitation    Baseline  60% limitation    Time  6    Period  Weeks    Status  On-going      PT LONG TERM GOAL #3   Title  perform regular walking for exercise > or = to 5 days a week    Baseline  too hot outside, now able to go to the pool    Time  6    Period  Weeks    Status  On-going    Target Date  11/19/18      PT LONG TERM GOAL #4   Title  report a 30% reduction in neck and lumbar pain with sitting and walking    Baseline  5-10%    Time  6    Period  Weeks    Status  On-going    Target Date  11/05/18      PT LONG TERM GOAL #5   Title  demonstrate Rt and Lt cervical A/ROM rotation to > or = to 75 degrees to improve safety with driving    Baseline  75 degrees bilaterally    Status  Achieved    Target Date  --      PT LONG TERM GOAL #6   Title  improve lumbar and Rt shoulder flexibility to allow for indendence with self-care (wiping and cleaning)    Time  6    Period  Weeks    Status  New    Target Date  11/05/18             Plan - 10/08/18 1306    Clinical Impression Statement  Pt continues to spend a lot of time in bed and reports being depressed.  PT encouraged pt to do as much activity as possible to boost mood.  Pt tolerated improved ability to exercise today in the clinic and PT provided redirection frequently to keep pt on task.  Pt with chronic pain and functional strength deficits.  Pt will do aquatic PT next session.    PT Frequency  2x / week    PT Duration  6 weeks    PT Treatment/Interventions  ADLs/Self Care Home Management;Cryotherapy;Electrical Stimulation;Moist Heat;Therapeutic exercise;Therapeutic activities;Functional mobility training;Neuromuscular re-education;Patient/family education;Manual techniques;Passive range of motion;Dry needling;Taping;Spinal Manipulations;Aquatic Therapy    PT Next Visit Plan  aquatic PT next session, gentle strength and mobility    PT Home Exercise Plan  Access Code: [email or web address]    Consulted and Agree with Plan of Care  Patient       Patient will benefit from skilled therapeutic intervention in order to improve the following deficits and impairments:  Pain, Improper body mechanics, Postural dysfunction, Increased muscle spasms, Decreased activity tolerance, Decreased endurance, Decreased range of motion, Decreased strength, Impaired flexibility  Visit Diagnosis: Muscle weakness (generalized)  Cervicalgia  Chronic bilateral low back pain, unspecified whether sciatica present     Problem List Patient Active Problem List   Diagnosis Date Noted  . Anxiety 12/25/2017     Lorrene ReidKelly Jontae Sonier, PT 10/08/18 1:08 PM  [facility] [address]  [address], [address] [place], [place], [address] Phone: [phone]   Fax:  [phone]  Name: Nathaniel Grant MRN: [id number] Date of Birth: Jan 17, 1961

## 2018-10-10 ENCOUNTER — Ambulatory Visit: Payer: Medicaid Other | Admitting: Physical Therapy

## 2018-10-15 ENCOUNTER — Ambulatory Visit: Payer: Medicaid Other

## 2018-10-15 ENCOUNTER — Other Ambulatory Visit: Payer: Self-pay

## 2018-10-15 DIAGNOSIS — M6281 Muscle weakness (generalized): Secondary | ICD-10-CM | POA: Diagnosis not present

## 2018-10-15 DIAGNOSIS — G8929 Other chronic pain: Secondary | ICD-10-CM

## 2018-10-15 DIAGNOSIS — M542 Cervicalgia: Secondary | ICD-10-CM

## 2018-10-15 NOTE — Therapy (Signed)
[facility] [address], [place], [place], [address] Phone: [phone]   Fax:  [phone]  Physical Therapy Treatment  Patient Details  Name: Nathaniel Grant MRN: [id number] Date of Birth: 1960/05/10 Referring Provider (PT): Rodell Perna, MD   Encounter Date: 10/15/2018  PT End of Session - 10/15/18 1303    Visit Number  17    Date for PT Re-Evaluation  11/05/18    Authorization Type  12 visits 8/24-10/05/2018    Authorization - Visit Number  3    Authorization - Number of Visits  12    PT Start Time  8341    PT Stop Time  9622    PT Time Calculation (min)  42 min    Activity Tolerance  Patient tolerated treatment well    Behavior During Therapy  [facility] for tasks assessed/performed       Past Medical History:  Diagnosis Date  . Depression     History reviewed. No pertinent surgical history.  There were no vitals filed for this visit.  Subjective Assessment - 10/15/18 1229    Subjective  I saw Dr Rolena Infante and he wants to do surgery and thinks I need to lose weight.    Currently in Pain?  Yes    Pain Score  7     Pain Location  Back    Pain Orientation  Left;Right    Pain Descriptors / Indicators  Aching;Sore    Pain Type  Chronic pain    Pain Onset  More than a month ago    Pain Frequency  Constant    Aggravating Factors   activity, standing/walking    Pain Relieving Factors  Soma, aquatic therapy    Pain Score  7    Pain Location  Neck    Pain Orientation  Left;Right    Pain Descriptors / Indicators  Aching;Tightness    Pain Type  Chronic pain    Pain Onset  More than a month ago    Pain Frequency  Constant    Aggravating Factors   turning head    Pain Relieving Factors  change of position, Soma                       [facility] Adult PT Treatment/Exercise - 10/15/18 0001      Lumbar Exercises: Aerobic   Nustep  level 5, seat #11, arm #11, for 10 minutes- Program 5   did at an even pace;  Profile 5     Lumbar Exercises: Standing   Shoulder Extension  Power Tower;Both;20 reps    Shoulder Extension Limitations  15# 2x10 with abdominal bracing    Other Standing Lumbar Exercises  walking in reverse: 10# x 15   cues for abdominal bracing x10     Lumbar Exercises: Seated   Sit to Stand Limitations  blue ball rolls forward and diagonals x 10 each    Other Seated Lumbar Exercises  press into foam roll for abdominal activation 5 second hold x20    Other Seated Lumbar Exercises  row with red band: 2x10               PT Short Term Goals - 09/04/18 1337      PT SHORT TERM GOAL #1   Title  be independent in initial HEP    Status  Achieved      PT SHORT TERM GOAL #2   Title  initiate a walking program  to improve endurance for community function    Time  4    Period  Weeks    Status  On-going      PT SHORT TERM GOAL #3   Title  verbalize understanding of pain neuroscience education and initiate 1 strategy    Status  Achieved        PT Long Term Goals - 10/15/18 1239      PT LONG TERM GOAL #3   Title  perform regular walking for exercise > or = to 5 days a week    Baseline  too hot outside, now able to go to the pool    Time  6    Period  Weeks    Status  On-going      PT LONG TERM GOAL #4   Title  report a 30% reduction in neck and lumbar pain with sitting and walking    Baseline  5-10%    Time  6    Period  Weeks    Status  On-going      PT LONG TERM GOAL #6   Title  improve lumbar and Rt shoulder flexibility to allow for indendence with self-care (wiping and cleaning)    Baseline  pt has starting stretching    Time  6    Period  Weeks    Status  On-going            Plan - 10/15/18 1256    Clinical Impression Statement  Pt arrived upset regarding his visit with Dr Shon BatonBrooks for orthopedic consult.  PT redirected pt frequently throughout the session.  Pt reports that he has spent a lot of time in bed and is not getting up to take walks.  PT added  rotational stretches today to improve mobility for self-care activities.  Pt was able to perform all activities in the clinic without increased pain.  Pt missed his aquatic therapy appointment last week and will resume this week.  Pt will continue to benefit from skilled PT for mobility, flexibility and strength.    PT Frequency  2x / week    PT Duration  6 weeks    PT Treatment/Interventions  ADLs/Self Care Home Management;Cryotherapy;Electrical Stimulation;Moist Heat;Therapeutic exercise;Therapeutic activities;Functional mobility training;Neuromuscular re-education;Patient/family education;Manual techniques;Passive range of motion;Dry needling;Taping;Spinal Manipulations;Aquatic Therapy    PT Next Visit Plan  aquatic PT next session, gentle strength and mobility    PT Home Exercise Plan  Access Code: [email or web address]    Consulted and Agree with Plan of Care  Patient       Patient will benefit from skilled therapeutic intervention in order to improve the following deficits and impairments:  Pain, Improper body mechanics, Postural dysfunction, Increased muscle spasms, Decreased activity tolerance, Decreased endurance, Decreased range of motion, Decreased strength, Impaired flexibility  Visit Diagnosis: Cervicalgia  Muscle weakness (generalized)  Chronic bilateral low back pain, unspecified whether sciatica present     Problem List Patient Active Problem List   Diagnosis Date Noted  . Anxiety 12/25/2017   Lorrene ReidKelly Mahari Strahm, PT 10/15/18 1:04 PM  [facility] [address], [address] [place], [place], [address] Phone: [phone]   Fax:  [phone]  Name: Nathaniel Grant MRN: [id number] Date of Birth: 05-26-1960

## 2018-10-17 ENCOUNTER — Encounter: Payer: Self-pay | Admitting: Physical Therapy

## 2018-10-17 ENCOUNTER — Other Ambulatory Visit: Payer: Self-pay

## 2018-10-17 ENCOUNTER — Ambulatory Visit: Payer: Medicaid Other | Admitting: Physical Therapy

## 2018-10-17 DIAGNOSIS — M542 Cervicalgia: Secondary | ICD-10-CM

## 2018-10-17 DIAGNOSIS — M6281 Muscle weakness (generalized): Secondary | ICD-10-CM | POA: Diagnosis not present

## 2018-10-17 DIAGNOSIS — M545 Low back pain, unspecified: Secondary | ICD-10-CM

## 2018-10-17 DIAGNOSIS — G8929 Other chronic pain: Secondary | ICD-10-CM

## 2018-10-17 NOTE — Therapy (Signed)
[facility] [address] [place], [place], [address] Phone: [phone]   Fax:  [phone]  Physical Therapy Treatment  Patient Details  Name: Nathaniel Grant MRN: [id number] Date of Birth: 1961/01/26 Referring Provider (PT): Rodell Perna, MD   Encounter Date: 10/17/2018  PT End of Session - 10/17/18 1623    Visit Number  18    Date for PT Re-Evaluation  11/05/18    Authorization Type  12 visits 8/24-10/05/2018    Authorization Time Period  08/18/18-09/28/18    Authorization - Visit Number  4    Authorization - Number of Visits  12    PT Start Time  1350    PT Stop Time  1430    PT Time Calculation (min)  40 min    Activity Tolerance  Patient tolerated treatment well    Behavior During Therapy  [facility] for tasks assessed/performed       Past Medical History:  Diagnosis Date  . Depression     History reviewed. No pertinent surgical history.  There were no vitals filed for this visit.  Subjective Assessment - 10/17/18 1623    Subjective  No pain today.    Pertinent History  chronic cervical and lumbar pain, diverticulitis, depression    Diagnostic tests  Pt had x-ray: C5-6 and C6-7 facet arthropahty and spurring.  Lumbar spondylosis: L2-3, L5-S1.    Currently in Pain?  No/denies    Multiple Pain Sites  No      Aquatic Session: Water temp 86.7 degrees. Pt entered and exited pool via long ramp with Bil hand rails.   Seated 75% body submerged for ankle,knee, hip AROM exercises with concurrent discussion of current status.   Water walking mid chest water level: 2 lengths in each direction. Added large noodle to push/pull with forward and backward walking an additional lap each. Pt was able to increase speed which created a current, allowing for greater resistance.  Bil hip abduction 15x Bil hip circumduction 15x  Done at pool edge,waist level water. VC for core activation  UE water weights total submersion:  shoulde flexion/extension 20x Breast stroke motions in both directions 10x each.  Decompression float x 1 min ( needed 2x throughout the session)  Bicycle x 4 min with large noodle behind pt. Stopped before goal of 5 minutes due to neck pain.   Cervical retraction 10x                           PT Short Term Goals - 09/04/18 1337      PT SHORT TERM GOAL #1   Title  be independent in initial HEP    Status  Achieved      PT SHORT TERM GOAL #2   Title  initiate a walking program to improve endurance for community function    Time  4    Period  Weeks    Status  On-going      PT SHORT TERM GOAL #3   Title  verbalize understanding of pain neuroscience education and initiate 1 strategy    Status  Achieved        PT Long Term Goals - 10/15/18 1239      PT LONG TERM GOAL #3   Title  perform regular walking for exercise > or = to 5 days a week    Baseline  too hot outside, now able to go to the pool  Time  6    Period  Weeks    Status  On-going      PT LONG TERM GOAL #4   Title  report a 30% reduction in neck and lumbar pain with sitting and walking    Baseline  5-10%    Time  6    Period  Weeks    Status  On-going      PT LONG TERM GOAL #6   Title  improve lumbar and Rt shoulder flexibility to allow for indendence with self-care (wiping and cleaning)    Baseline  pt has starting stretching    Time  6    Period  Weeks    Status  On-going            Plan - 10/17/18 1624    Clinical Impression Statement  Pt was able to add more resistance to his water walking by creating more current and adding a push/pull with a large noodle for the UE. Pt was also able to increase his reps with LE strengthening exercises. He had some neck pain with the bicycle as his neck does goes into extension some. Educated pt in cervical retraction motion which seemed to lessen his neck pain.PTA educated pt how to do this exercise for HEP to help his forward head  position. Pt requires extra cuing for his core when he performs his LE exercises.    Personal Factors and Comorbidities  Comorbidity 2    Comorbidities  obesity, chronic low back and cervical pain    Examination-Activity Limitations  Dressing;Sit;Stand;Stairs;Hygiene/Grooming;Bathing;Squat    Examination-Participation Restrictions  Community Activity;Meal Prep;Driving    Stability/Clinical Decision Making  Evolving/Moderate complexity    Rehab Potential  Good    PT Frequency  2x / week    PT Duration  6 weeks    PT Treatment/Interventions  ADLs/Self Care Home Management;Cryotherapy;Electrical Stimulation;Moist Heat;Therapeutic exercise;Therapeutic activities;Functional mobility training;Neuromuscular re-education;Patient/family education;Manual techniques;Passive range of motion;Dry needling;Taping;Spinal Manipulations;Aquatic Therapy    PT Next Visit Plan  Pt back in clinic for land based exercises.    PT Home Exercise Plan  Access Code: [email or web address]    Consulted and Agree with Plan of Care  Patient       Patient will benefit from skilled therapeutic intervention in order to improve the following deficits and impairments:  Pain, Improper body mechanics, Postural dysfunction, Increased muscle spasms, Decreased activity tolerance, Decreased endurance, Decreased range of motion, Decreased strength, Impaired flexibility  Visit Diagnosis: Cervicalgia  Muscle weakness (generalized)  Chronic bilateral low back pain, unspecified whether sciatica present     Problem List Patient Active Problem List   Diagnosis Date Noted  . Anxiety 12/25/2017    Nathaniel Grant, {PTA 10/17/2018, 4:29 PM  [facility] [address], [address] [place], [place], [address] Phone: [phone]   Fax:  [phone]  Name: Nathaniel Grant MRN: [id number] Date of Birth: 1960/04/16

## 2018-10-22 ENCOUNTER — Other Ambulatory Visit: Payer: Self-pay

## 2018-10-22 ENCOUNTER — Ambulatory Visit: Payer: Medicaid Other

## 2018-10-22 DIAGNOSIS — M6281 Muscle weakness (generalized): Secondary | ICD-10-CM

## 2018-10-22 DIAGNOSIS — G8929 Other chronic pain: Secondary | ICD-10-CM

## 2018-10-22 DIAGNOSIS — M542 Cervicalgia: Secondary | ICD-10-CM

## 2018-10-22 NOTE — Therapy (Addendum)
[facility] [address], [place], [place], [address] Phone: [phone]   Fax:  [phone]  Physical Therapy Treatment  Patient Details  Name: Nathaniel Grant MRN: [id number] Date of Birth: [birth year] Referring Provider (PT): Rodell Perna, MD   Encounter Date: 10/22/2018  PT End of Session - 10/22/18 1306    Visit Number  19    Date for PT Re-Evaluation  11/05/18    Authorization Type  12 visits 8/24-10/05/2018    Authorization - Visit Number  5    Authorization - Number of Visits  12    PT Start Time  1228    PT Stop Time  2725    PT Time Calculation (min)  39 min    Activity Tolerance  Patient tolerated treatment well    Behavior During Therapy  [facility] for tasks assessed/performed       Past Medical History:  Diagnosis Date  . Depression     History reviewed. No pertinent surgical history.  There were no vitals filed for this visit.                    [facility] Adult PT Treatment/Exercise - 10/22/18 0001      Lumbar Exercises: Aerobic   Nustep  level 5, seat #11, arm #11, for 10 minutes- Program 5   did at an even pace; Profile 5     Lumbar Exercises: Standing   Shoulder Extension  Power Tower;Both;20 reps    Shoulder Extension Limitations  15# 2x10    Other Standing Lumbar Exercises  walking in reverse: 10# x 15   cues for abdominal bracing x10     Lumbar Exercises: Seated   Sit to Stand Limitations  blue ball rolls forward and diagonals x 10 each    Other Seated Lumbar Exercises  press into foam roll for abdominal activation 5 second hold x20    Other Seated Lumbar Exercises  seated rotation with passing weight behind the back- some assistance by PT to reach the weight               PT Short Term Goals - 09/04/18 1337      PT SHORT TERM GOAL #1   Title  be independent in initial HEP    Status  Achieved      PT SHORT TERM GOAL #2   Title  initiate a walking program to  improve endurance for community function    Time  4    Period  Weeks    Status  On-going      PT SHORT TERM GOAL #3   Title  verbalize understanding of pain neuroscience education and initiate 1 strategy    Status  Achieved        PT Long Term Goals - 10/22/18 1238      PT LONG TERM GOAL #3   Title  perform regular walking for exercise > or = to 5 days a week    Baseline  going to the pool 1x/wk, pt is not active during the day- depression    Time  6    Period  Weeks    Status  On-going      PT LONG TERM GOAL #6   Title  improve lumbar and Rt shoulder flexibility to allow for indendence with self-care (wiping and cleaning)    Baseline  pt has starting stretching    Time  6    Period  Weeks  Status  On-going            Plan - 10/22/18 1252    Clinical Impression Statement  Pt continues to report that he doesn't do much at home and spends a lot of time in bed due to depression.  Pt has been advancing his exercise in the pool over the past 2 weeks.  Pt with lower pain levels today (5-7/10).  No guarding with movement today and was able to participate in exercise for 40 minutes today.  Pt is working on rotation exercises in the clinic to assist with ease of self-care tasks.  Pt will continue to benefit from skilled PT to address chronic pain and improve mobility and endurance.    Rehab Potential  Good    PT Frequency  2x / week    PT Duration  6 weeks    PT Treatment/Interventions  ADLs/Self Care Home Management;Cryotherapy;Electrical Stimulation;Moist Heat;Therapeutic exercise;Therapeutic activities;Functional mobility training;Neuromuscular re-education;Patient/family education;Manual techniques;Passive range of motion;Dry needling;Taping;Spinal Manipulations;Aquatic Therapy    PT Next Visit Plan  pt will have water therapy next session.  work on trunk rotation to improve mobility needed for self-care    PT Home Exercise Plan  Access Code: [email or web address]    Consulted and Agree  with Plan of Care  Patient       Patient will benefit from skilled therapeutic intervention in order to improve the following deficits and impairments:  Pain, Improper body mechanics, Postural dysfunction, Increased muscle spasms, Decreased activity tolerance, Decreased endurance, Decreased range of motion, Decreased strength, Impaired flexibility  Visit Diagnosis: Muscle weakness (generalized)  Chronic bilateral low back pain, unspecified whether sciatica present  Cervicalgia     Problem List Patient Active Problem List   Diagnosis Date Noted  . Anxiety 12/25/2017     Lorrene ReidKelly Kevante Lunt, PT 10/22/18 2:00 PM  [facility] [address], [address] [place], [place], [address] Phone: [phone]   Fax:  [phone]  Name: Nathaniel SorensonRichard Vitug MRN: [id number] Date of Birth: 02/11/1960

## 2018-10-24 ENCOUNTER — Other Ambulatory Visit: Payer: Self-pay

## 2018-10-24 ENCOUNTER — Ambulatory Visit: Payer: Medicaid Other | Admitting: Physical Therapy

## 2018-10-24 ENCOUNTER — Encounter: Payer: Self-pay | Admitting: Physical Therapy

## 2018-10-24 DIAGNOSIS — M542 Cervicalgia: Secondary | ICD-10-CM

## 2018-10-24 DIAGNOSIS — G8929 Other chronic pain: Secondary | ICD-10-CM

## 2018-10-24 DIAGNOSIS — M6281 Muscle weakness (generalized): Secondary | ICD-10-CM

## 2018-10-24 NOTE — Therapy (Signed)
[facility] [address] [place], [place], [address] Phone: [phone]   Fax:  [phone]  Physical Therapy Treatment  Patient Details  Name: Nathaniel Grant MRN: [id number] Date of Birth: [birth year] Referring Provider (PT): Rodell Perna, MD   Encounter Date: 10/24/2018  PT End of Session - 10/24/18 2025    Visit Number  20    Date for PT Re-Evaluation  11/05/18    Authorization Type  12 visits 8/24-10/05/2018    Authorization Time Period  08/18/18-09/28/18    Authorization - Visit Number  6    Authorization - Number of Visits  12    PT Start Time  5573    PT Stop Time  1425    PT Time Calculation (min)  40 min    Activity Tolerance  Patient tolerated treatment well    Behavior During Therapy  [facility] for tasks assessed/performed       Past Medical History:  Diagnosis Date  . Depression     History reviewed. No pertinent surgical history.  There were no vitals filed for this visit.  Subjective Assessment - 10/24/18 2029    Subjective  No new complaints, no current pain.    Currently in Pain?  No/denies      Treatment:  Aquatic session: Water temperature 86.7 degrees F Pt enters and exits the pool via long ramp using bil hand rails.   Seated on bench with 90% submersion: AROM ankle, knee, hip exercises as we discuss his current status and what he would like to accomplish today in the pool.   Mid trunk water walking: pt able to generate good current for increased current in addition he could use the noodle for rowing and pushing to emphasize Ue strength and endurance. 2 lengths in each direction.   Standing abdominal/core compressions with pool noodle. 5 sec hold 10x.  Standing trunk rotations: 2x5 bil pt holds noodle for support of his back. No pain, just grunting.  Water bicycle x 5 min, cervical pain last 30 sec. Cervical retraction 5x post. UE water weights 20x each breast stroke then reverse. No shoulder  pain.  Decompression float x 3 min post session.                            PT Short Term Goals - 09/04/18 1337      PT SHORT TERM GOAL #1   Title  be independent in initial HEP    Status  Achieved      PT SHORT TERM GOAL #2   Title  initiate a walking program to improve endurance for community function    Time  4    Period  Weeks    Status  On-going      PT SHORT TERM GOAL #3   Title  verbalize understanding of pain neuroscience education and initiate 1 strategy    Status  Achieved        PT Long Term Goals - 10/22/18 1238      PT LONG TERM GOAL #3   Title  perform regular walking for exercise > or = to 5 days a week    Baseline  going to the pool 1x/wk, pt is not active during the day- depression    Time  6    Period  Weeks    Status  On-going      PT LONG TERM GOAL #6   Title  improve lumbar  and Rt shoulder flexibility to allow for indendence with self-care (wiping and cleaning)    Baseline  pt has starting stretching    Time  6    Period  Weeks    Status  On-going            Plan - 10/24/18 2030    Clinical Impression Statement  Pt arrives "feeling pretty good." He was able to generate a good current with his water walking, demonstrating ability to tolerate more work/resistance. He could rotate his trunk fully each side using the pool noodle to assist in supporting his trunk. Pt did haveneck pain with his water bicycle exercise but this was abolished with cervical retraction. Pt tolerates the water UE weigths very well, causing no shoulder pain. Pt does speak of continuing to participate in aquatic exercise upon his DC from formal PT. PTA gave pt information on membership at the [facility].    Personal Factors and Comorbidities  Comorbidity 2    Comorbidities  obesity, chronic low back and cervical pain    Examination-Activity Limitations  Dressing;Sit;Stand;Stairs;Hygiene/Grooming;Bathing;Squat    Examination-Participation Restrictions   Community Activity;Meal Prep;Driving    Stability/Clinical Decision Making  Evolving/Moderate complexity    Rehab Potential  Good    PT Frequency  2x / week    PT Duration  6 weeks    PT Treatment/Interventions  ADLs/Self Care Home Management;Cryotherapy;Electrical Stimulation;Moist Heat;Therapeutic exercise;Therapeutic activities;Functional mobility training;Neuromuscular re-education;Patient/family education;Manual techniques;Passive range of motion;Dry needling;Taping;Spinal Manipulations;Aquatic Therapy    PT Next Visit Plan  land PT next    PT Home Exercise Plan  Access Code: [email or web address]    Consulted and Agree with Plan of Care  Patient       Patient will benefit from skilled therapeutic intervention in order to improve the following deficits and impairments:  Pain, Improper body mechanics, Postural dysfunction, Increased muscle spasms, Decreased activity tolerance, Decreased endurance, Decreased range of motion, Decreased strength, Impaired flexibility  Visit Diagnosis: Muscle weakness (generalized)  Chronic bilateral low back pain, unspecified whether sciatica present  Cervicalgia     Problem List Patient Active Problem List   Diagnosis Date Noted  . Anxiety 12/25/2017    Janeva Peaster, PTA 10/24/2018, 8:41 PM  [facility] [address], [address] [place], [place], [address] Phone: [phone]   Fax:  [phone]  Name: Laverda SorensonRichard Klecka MRN: [id number] Date of Birth: 10/20/1960

## 2018-10-29 ENCOUNTER — Other Ambulatory Visit: Payer: Self-pay

## 2018-10-29 ENCOUNTER — Ambulatory Visit: Payer: Medicaid Other

## 2018-10-29 DIAGNOSIS — M545 Low back pain, unspecified: Secondary | ICD-10-CM

## 2018-10-29 DIAGNOSIS — M6281 Muscle weakness (generalized): Secondary | ICD-10-CM

## 2018-10-29 DIAGNOSIS — G8929 Other chronic pain: Secondary | ICD-10-CM

## 2018-10-29 DIAGNOSIS — M542 Cervicalgia: Secondary | ICD-10-CM

## 2018-10-29 NOTE — Therapy (Signed)
[facility] [address] [place], [place], [address] Phone: [phone]   Fax:  [phone]  Physical Therapy Treatment  Patient Details  Name: Nathaniel Grant MRN: [id number] Date of Birth: Sep 18, 1960 Referring Provider (PT): Rodell Perna, MD   Encounter Date: 10/29/2018  PT End of Session - 10/29/18 1310    Visit Number  21    Date for PT Re-Evaluation  11/05/18    Authorization Type  12 visits 8/24-10/05/2018    Authorization - Visit Number  7    Authorization - Number of Visits  12    PT Start Time  8250    PT Stop Time  1309    PT Time Calculation (min)  45 min    Activity Tolerance  Patient tolerated treatment well    Behavior During Therapy  [facility] for tasks assessed/performed       Past Medical History:  Diagnosis Date  . Depression     History reviewed. No pertinent surgical history.  There were no vitals filed for this visit.  Subjective Assessment - 10/29/18 1222    Subjective  I am doing OK today.  My arms are going numb.    Currently in Pain?  Yes    Pain Score  6     Pain Location  Back    Pain Orientation  Right;Left;Lower    Pain Type  Chronic pain    Pain Onset  More than a month ago    Pain Frequency  Constant    Aggravating Factors   activity, standing/walking    Pain Relieving Factors  aquatic therapy    Pain Score  5    Pain Location  Neck    Pain Orientation  Right;Left    Pain Descriptors / Indicators  Aching;Tightness    Pain Onset  More than a month ago    Pain Frequency  Constant    Aggravating Factors   turning head    Pain Relieving Factors  change of position, Soma                       [facility] Adult PT Treatment/Exercise - 10/29/18 0001      Lumbar Exercises: Aerobic   Nustep  level 5, seat #11, arm #11, for 10 minutes- Program 5   did at an even pace; Profile 5     Lumbar Exercises: Standing   Other Standing Lumbar Exercises  walking in reverse: 10# x  15   cues for abdominal bracing x10     Lumbar Exercises: Seated   Sit to Stand Limitations  blue ball rolls forward and diagonals x 10 each    Other Seated Lumbar Exercises  press into foam roll for abdominal activation 5 second hold x20    Other Seated Lumbar Exercises  seated rotation with passing weight behind the back- some assistance by PT to reach the weight             PT Education - 10/29/18 1310    Education Details  introduction to pain neuroscience education    Person(s) Educated  Patient    Methods  Explanation    Comprehension  Verbalized understanding       PT Short Term Goals - 09/04/18 1337      PT SHORT TERM GOAL #1   Title  be independent in initial HEP    Status  Achieved      PT SHORT TERM GOAL #2  Title  initiate a walking program to improve endurance for community function    Time  4    Period  Weeks    Status  On-going      PT SHORT TERM GOAL #3   Title  verbalize understanding of pain neuroscience education and initiate 1 strategy    Status  Achieved        PT Long Term Goals - 10/29/18 1245      PT LONG TERM GOAL #1   Title  be independent in advanced HEP    Time  8    Period  Weeks    Status  On-going      PT LONG TERM GOAL #3   Title  perform regular walking for exercise > or = to 5 days a week    Baseline  going to the pool 1x/wk, pt is not active during the day- depression    Time  6            Plan - 10/29/18 1251    Clinical Impression Statement  Pt reports that he is awake more frequently at home and isn't in bed as much.   Pt has been advancing his exercise in the pool over the past 2 weeks.  Pt with lower pain levels today (5/10).  No guarding with movement today and was able to participate in exercise and advancement to step-ups.  Pt is working on rotation exercises in the clinic to assist with ease of self-care tasks.  PT encouraged pt to take walks now that weather is cooler and he is not in bed as much.  Pt agreed  to try. Pt will continue to benefit from skilled PT to address chronic pain and improve mobility and endurance.    PT Frequency  2x / week    PT Duration  6 weeks    PT Treatment/Interventions  ADLs/Self Care Home Management;Cryotherapy;Electrical Stimulation;Moist Heat;Therapeutic exercise;Therapeutic activities;Functional mobility training;Neuromuscular re-education;Patient/family education;Manual techniques;Passive range of motion;Dry needling;Taping;Spinal Manipulations;Aquatic Therapy    PT Next Visit Plan  aquatic  PT next       Patient will benefit from skilled therapeutic intervention in order to improve the following deficits and impairments:  Pain, Improper body mechanics, Postural dysfunction, Increased muscle spasms, Decreased activity tolerance, Decreased endurance, Decreased range of motion, Decreased strength, Impaired flexibility  Visit Diagnosis: Muscle weakness (generalized)  Chronic bilateral low back pain, unspecified whether sciatica present  Cervicalgia     Problem List Patient Active Problem List   Diagnosis Date Noted  . Anxiety 12/25/2017    Lorrene Reid, PT 10/29/18 1:16 PM  [facility] [address], [address] [place], [place], [address] Phone: [phone]   Fax:  [phone]  Name: Nathaniel Grant MRN: [id number] Date of Birth: 16-Feb-1960

## 2018-10-31 ENCOUNTER — Encounter: Payer: Self-pay | Admitting: Physical Therapy

## 2018-10-31 ENCOUNTER — Other Ambulatory Visit: Payer: Self-pay

## 2018-10-31 ENCOUNTER — Ambulatory Visit: Payer: Medicaid Other | Admitting: Physical Therapy

## 2018-10-31 DIAGNOSIS — M6281 Muscle weakness (generalized): Secondary | ICD-10-CM

## 2018-10-31 DIAGNOSIS — M542 Cervicalgia: Secondary | ICD-10-CM

## 2018-10-31 DIAGNOSIS — M545 Low back pain, unspecified: Secondary | ICD-10-CM

## 2018-10-31 DIAGNOSIS — G8929 Other chronic pain: Secondary | ICD-10-CM

## 2018-10-31 NOTE — Therapy (Signed)
[facility] [address] [place], [place], [address] Phone: [phone]   Fax:  [phone]  Physical Therapy Treatment  Patient Details  Name: Nathaniel Grant MRN: [id number] Date of Birth: 1960-08-20 Referring Provider (PT): Annell Greening, MD   Encounter Date: 10/31/2018  PT End of Session - 10/31/18 1611    Visit Number  22    Date for PT Re-Evaluation  11/05/18    Authorization Type  12 visits 8/24-10/05/2018    Authorization Time Period  08/18/18-09/28/18    Authorization - Visit Number  8    Authorization - Number of Visits  12    PT Start Time  1345    PT Stop Time  1430    PT Time Calculation (min)  45 min    Activity Tolerance  Patient tolerated treatment well    Behavior During Therapy  [facility] for tasks assessed/performed       Past Medical History:  Diagnosis Date  . Depression     History reviewed. No pertinent surgical history.  There were no vitals filed for this visit.  Subjective Assessment - 10/31/18 1611    Subjective  Had a lot of back pain when I woke up, I went back to sleep and when I got up the pain was gone.    Pertinent History  chronic cervical and lumbar pain, diverticulitis, depression    Currently in Pain?  No/denies    Multiple Pain Sites  No      Treatment Aquatic Therapy Water temperature 86.7 degrees F Pt entered and exited pool via long ramp with bil hand rails  Seated: 75% body submerged for ankle, knee, hip A/ROM exercises as we reviewed his current status and what he would like to accomplish today.  Mid trunk water walking 4 lengths each direction increasing current to increase resistance.  Decompression float with noodle x1 min  Abdominal compressions with noodle 5 sec hold 10x Trunk rotation using the noodle for trunk support and support for rotation 10x bil No pain Water weights for UE rows and extension with complete submersion 20x each bil Bicycle x 5 min with  noodle, no neck pain.                            PT Short Term Goals - 09/04/18 1337      PT SHORT TERM GOAL #1   Title  be independent in initial HEP    Status  Achieved      PT SHORT TERM GOAL #2   Title  initiate a walking program to improve endurance for community function    Time  4    Period  Weeks    Status  On-going      PT SHORT TERM GOAL #3   Title  verbalize understanding of pain neuroscience education and initiate 1 strategy    Status  Achieved        PT Long Term Goals - 10/29/18 1245      PT LONG TERM GOAL #1   Title  be independent in advanced HEP    Time  8    Period  Weeks    Status  On-going      PT LONG TERM GOAL #3   Title  perform regular walking for exercise > or = to 5 days a week    Baseline  going to the pool 1x/wk, pt is not active during the day-  depression    Time  6            Plan - 10/31/18 1613    Clinical Impression Statement  Pt arrives today for aquatic therapy with no complaints of pain. He did report intense back pain upon waking that abolished after returning to sleep. Pt did not have any pain while exercising in the water, He is not sure he can affoard to do pool exercises post therapy. PTA suggested speaking with our [employer] leasion to discussion scholarship opportunities.    Personal Factors and Comorbidities  Comorbidity 2    Comorbidities  obesity, chronic low back and cervical pain    Examination-Activity Limitations  Dressing;Sit;Stand;Stairs;Hygiene/Grooming;Bathing;Squat    Examination-Participation Restrictions  Community Activity;Meal Prep;Driving    Stability/Clinical Decision Making  Evolving/Moderate complexity    Rehab Potential  Good    PT Frequency  2x / week    PT Duration  6 weeks    PT Treatment/Interventions  ADLs/Self Care Home Management;Cryotherapy;Electrical Stimulation;Moist Heat;Therapeutic exercise;Therapeutic activities;Functional mobility training;Neuromuscular  re-education;Patient/family education;Manual techniques;Passive range of motion;Dry needling;Taping;Spinal Manipulations;Aquatic Therapy    PT Next Visit Plan  [facility] PT, pt may have re-eval next week?    PT Home Exercise Plan  Access Code: [email or web address]    Consulted and Agree with Plan of Care  Patient       Patient will benefit from skilled therapeutic intervention in order to improve the following deficits and impairments:  Pain, Improper body mechanics, Postural dysfunction, Increased muscle spasms, Decreased activity tolerance, Decreased endurance, Decreased range of motion, Decreased strength, Impaired flexibility  Visit Diagnosis: Muscle weakness (generalized)  Chronic bilateral low back pain, unspecified whether sciatica present  Cervicalgia     Problem List Patient Active Problem List   Diagnosis Date Noted  . Anxiety 12/25/2017    Marshall Roehrich, PTA 10/31/2018, 4:16 PM  [facility] [address], [place], [place], [address] Phone: [phone]   Fax:  [phone]  Name: Nathaniel Grant MRN: [id number] Date of Birth: May 12, 1960

## 2018-11-05 ENCOUNTER — Other Ambulatory Visit: Payer: Self-pay

## 2018-11-05 ENCOUNTER — Ambulatory Visit: Payer: Medicaid Other

## 2018-11-05 DIAGNOSIS — G8929 Other chronic pain: Secondary | ICD-10-CM

## 2018-11-05 DIAGNOSIS — M6281 Muscle weakness (generalized): Secondary | ICD-10-CM | POA: Diagnosis not present

## 2018-11-05 DIAGNOSIS — M542 Cervicalgia: Secondary | ICD-10-CM

## 2018-11-05 NOTE — Therapy (Signed)
[facility] [address], [address] [place], [place], [address] Phone: [phone]   Fax:  [phone]  Physical Therapy Treatment  Patient Details  Name: Nathaniel SorensonRichard Grant MRN: [id number] Date of Birth: 1960/05/13 Referring Provider (PT): Annell GreeningYates, Mark, MD   Encounter Date: 11/05/2018  PT End of Session - 11/05/18 1316    Visit Number  23    Date for PT Re-Evaluation  12/17/18    Authorization Type  visits requested 11/05/2018    Authorization - Visit Number  9    Authorization - Number of Visits  12    PT Start Time  1224    PT Stop Time  1311    PT Time Calculation (min)  47 min    Activity Tolerance  Patient tolerated treatment well    Behavior During Therapy  [facility] for [facility] assessed/performed       Past Medical History:  Diagnosis Date  . Depression     History reviewed. No pertinent surgical history.  There were no vitals filed for this visit.  Subjective Assessment - 11/05/18 1234    Subjective  My pain is not too bad.  I am a 6-7/10 in my neck and low back today.  My pain hasn't changed with PT, my ROM and strength is better.  ~20% improvement- physically and psychologically I feel better    Diagnostic tests  Pt had x-ray: C5-6 and C6-7 facet arthropahty and spurring.  Lumbar spondylosis: L2-3, L5-S1.    Patient Stated Goals  reduce pain, sit/stand/walk longer    Currently in Pain?  Yes    Pain Score  7     Pain Location  Back    Pain Orientation  Left;Right;Lower    Pain Descriptors / Indicators  Aching;Sore    Pain Type  Chronic pain    Pain Onset  More than a month ago    Pain Frequency  Constant    Aggravating Factors   activity, standing/walking    Pain Relieving Factors  aquatic therapy    Pain Score  7    Pain Location  Neck    Pain Orientation  Right;Left    Pain Descriptors / Indicators  Aching;Tightness    Pain Type  Chronic pain    Pain Onset  More than a month ago    Pain Frequency  Constant    Aggravating Factors   turning head    Pain Relieving Factors  change of position, Soma         [facility] - 11/05/18 0001      Assessment   Medical Diagnosis  chronic bilateral low back pain, neck pain    Referring Provider (PT)  Annell GreeningYates, Mark, MD      Cognition   Overall Cognitive Status  Within Functional Limits for tasks assessed      Observation/Other Assessments   Focus on Therapeutic Outcomes (FOTO)   59% limitation      AROM   Cervical - Right Rotation  85    Cervical - Left Rotation  80      Strength   Overall Strength  Deficits                   [facility] Adult PT Treatment/Exercise - 11/05/18 0001      Lumbar Exercises: Aerobic   Nustep  level 5, seat #11, arm #11, for 10 minutes- Program 5   did at an even pace; Profile 5     Lumbar  Exercises: Standing   Other Standing Lumbar Exercises  walking in reverse: 10# x 15   cues for abdominal bracing x10     Lumbar Exercises: Seated   Sit to Stand Limitations  blue ball rolls forward and diagonals x 10 each    Other Seated Lumbar Exercises  press into foam roll for abdominal activation 5 second hold x20               PT Short Term Goals - 09/04/18 1337      PT SHORT TERM GOAL #1   Title  be independent in initial HEP    Status  Achieved      PT SHORT TERM GOAL #2   Title  initiate a walking program to improve endurance for community function    Time  4    Period  Weeks    Status  On-going      PT SHORT TERM GOAL #3   Title  verbalize understanding of pain neuroscience education and initiate 1 strategy    Status  Achieved        PT Long Term Goals - 11/05/18 1240      PT LONG TERM GOAL #1   Title  be independent in advanced HEP    Time  6    Period  Weeks    Status  On-going    Target Date  12/17/18      PT LONG TERM GOAL #2   Title  reduce FOTO to < or = to 59% limitation    Baseline  59%    Status  Achieved      PT LONG TERM GOAL #3   Title  perform regular  walking for exercise > or = to 5 days a week    Baseline  going to the pool 1x/wk, pt took 2 walks this week    Time  6    Period  Weeks    Status  On-going    Target Date  12/17/18      PT LONG TERM GOAL #4   Title  report a 30% reduction in neck and lumbar pain with sitting and walking    Baseline  20%- improved mobility and strength    Time  6    Period  Weeks    Status  On-going    Target Date  12/17/18      PT LONG TERM GOAL #5   Title  demonstrate Rt and Lt cervical A/ROM rotation to > or = to 75 degrees to improve safety with driving    Baseline  85 and 80    Status  Achieved      PT LONG TERM GOAL #6   Title  improve lumbar and Rt shoulder flexibility to allow for indendence with self-care (wiping and cleaning)    Baseline  stretching at home and in the pool- improved flexibility but still having difficulty    Time  6    Period  Weeks    Status  On-going            Plan - 11/05/18 1259    Clinical Impression Statement  Pt reports 20% improvement in overall flexibility and strength and reports that he is able to move with increased ease.  Pt continues to report 6-7/10 cervical and lumbar pain.  Pt has been successful accomplishing exercise in the pool through aquatic PT.  Pt reports increased ease with trunk rotation required for self-care and bathing/washing yet this still remains  a challenge.  FOTO is slightly improved to 59% limitation.  Pt has started taking walks at home for exercise which is improved from being in bed due to depression.  Pt is able to tolerate 40 minutes of exercise in the clinic and has been able to advance aquatic exercises in the pool.  Pt will continue to benefit from skilled PT to improve mobility, flexibility and strength to improve tolerance for housework, standing and walking and self-care tasks.    Rehab Potential  Good    PT Frequency  2x / week    PT Duration  6 weeks    PT Treatment/Interventions  ADLs/Self Care Home  Management;Cryotherapy;Electrical Stimulation;Moist Heat;Therapeutic exercise;Therapeutic activities;Functional mobility training;Neuromuscular re-education;Patient/family education;Manual techniques;Passive range of motion;Dry needling;Taping;Spinal Manipulations;Aquatic Therapy    PT Next Visit Plan  aquatic and land based PT for strength, flexibility and mobility    PT Home Exercise Plan  Access Code: [email or web address]    Recommended Other Services  recert sent 7/41/28    Consulted and Agree with Plan of Care  Patient       Patient will benefit from skilled therapeutic intervention in order to improve the following deficits and impairments:  Pain, Improper body mechanics, Postural dysfunction, Increased muscle spasms, Decreased activity tolerance, Decreased endurance, Decreased range of motion, Decreased strength, Impaired flexibility  Visit Diagnosis: Muscle weakness (generalized)  Chronic bilateral low back pain, unspecified whether sciatica present  Cervicalgia     Problem List Patient Active Problem List   Diagnosis Date Noted  . Anxiety 12/25/2017     Sigurd Sos, PT 11/05/18 1:19 PM  [facility] [address] [place], [place], [address] Phone: [phone]   Fax:  [phone]  Name: Zaylon Bossier MRN: [id number] Date of Birth: 09/06/1960

## 2018-11-07 ENCOUNTER — Other Ambulatory Visit: Payer: Self-pay

## 2018-11-07 ENCOUNTER — Ambulatory Visit: Payer: Medicaid Other | Attending: Orthopaedic Surgery | Admitting: Physical Therapy

## 2018-11-07 ENCOUNTER — Encounter: Payer: Self-pay | Admitting: Physical Therapy

## 2018-11-07 DIAGNOSIS — M542 Cervicalgia: Secondary | ICD-10-CM | POA: Diagnosis present

## 2018-11-07 DIAGNOSIS — G8929 Other chronic pain: Secondary | ICD-10-CM | POA: Diagnosis present

## 2018-11-07 DIAGNOSIS — M6281 Muscle weakness (generalized): Secondary | ICD-10-CM | POA: Diagnosis present

## 2018-11-07 DIAGNOSIS — M545 Low back pain: Secondary | ICD-10-CM | POA: Diagnosis present

## 2018-11-07 NOTE — Therapy (Signed)
[facility] [address], [address] [place], [place], [address] Phone: [phone]   Fax:  [phone]  Physical Therapy Treatment  Patient Details  Name: Nathaniel Grant MRN: [id number] Date of Birth: 05-06-1960 Referring Provider (PT): Annell Greening, MD   Encounter Date: 11/07/2018  PT End of Session - 11/07/18 1628    Visit Number  24    Date for PT Re-Evaluation  12/17/18    Authorization Type  visits requested 11/05/2018    Authorization Time Period  08/18/18-09/28/18    Authorization - Visit Number  10    Authorization - Number of Visits  12    PT Start Time  1350    PT Stop Time  1430    PT Time Calculation (min)  40 min    Activity Tolerance  Patient tolerated treatment well    Behavior During Therapy  [facility] for tasks assessed/performed       Past Medical History:  Diagnosis Date  . Depression     History reviewed. No pertinent surgical history.  There were no vitals filed for this visit.  Subjective Assessment - 11/07/18 1626    Subjective  My LT lumbar is really hurting today. Pt ambulating with greater forward pitch/hinge due to pain today.    Pertinent History  chronic cervical and lumbar pain, diverticulitis, depression    Currently in Pain?  Yes    Pain Score  8     Pain Location  Back    Pain Orientation  Lower;Left    Pain Descriptors / Indicators  Sharp;Tender;Tightness      Treatment; Aquatic session Water temperature 86.7 degrees Pt entered and exited the pool via long ramp utilizing bil hand rails heavily today. Pt is hinged forward from his trunk.  Seated at bench 75% submersion: Ankle, knee, hip AROM with concurrent discussion of pain and what he would like to accomplish today.  Hamstring stretches seated at bench with 75% body submersion. Bil 3x 20 sec  Water walking 1 length in mid trunk depth, VC to move slow to assess pain. Pt was able to walk with normal pace and no increase in pain.    Decompression float x with noodle behind pt. Same position QL/leg lengthener stretch. Took some practice and multiple attempts at different VC.   Standing thoracic and shoulder rotation 10x bil holding on noodle to give back support.   Finished session with 3 min more of decompression float as prior. PTA present to monitor pain.                            PT Short Term Goals - 09/04/18 1337      PT SHORT TERM GOAL #1   Title  be independent in initial HEP    Status  Achieved      PT SHORT TERM GOAL #2   Title  initiate a walking program to improve endurance for community function    Time  4    Period  Weeks    Status  On-going      PT SHORT TERM GOAL #3   Title  verbalize understanding of pain neuroscience education and initiate 1 strategy    Status  Achieved        PT Long Term Goals - 11/05/18 1240      PT LONG TERM GOAL #1   Title  be independent in advanced HEP    Time  6  Period  Weeks    Status  On-going    Target Date  12/17/18      PT LONG TERM GOAL #2   Title  reduce FOTO to < or = to 59% limitation    Baseline  59%    Status  Achieved      PT LONG TERM GOAL #3   Title  perform regular walking for exercise > or = to 5 days a week    Baseline  going to the pool 1x/wk, pt took 2 walks this week    Time  6    Period  Weeks    Status  On-going    Target Date  12/17/18      PT LONG TERM GOAL #4   Title  report a 30% reduction in neck and lumbar pain with sitting and walking    Baseline  20%- improved mobility and strength    Time  6    Period  Weeks    Status  On-going    Target Date  12/17/18      PT LONG TERM GOAL #5   Title  demonstrate Rt and Lt cervical A/ROM rotation to > or = to 75 degrees to improve safety with driving    Baseline  85 and 80    Status  Achieved      PT LONG TERM GOAL #6   Title  improve lumbar and Rt shoulder flexibility to allow for indendence with self-care (wiping and cleaning)     Baseline  stretching at home and in the pool- improved flexibility but still having difficulty    Time  6    Period  Weeks    Status  On-going            Plan - 11/07/18 1628    Clinical Impression Statement  Pt arrives to aquatic therapy today with increased lumbar pain specifically the LT. He is unsure why he is having this flare up today.  At first pt was asked to move slowly to assess how his pain would respond. Pt did nothave any increase of pain, nor did her demonstrate any negative pain behaviors such as guarding or wincing. Pt reportsmoving and stretching in the water was helpful to be able to stretch with less pain today.    Personal Factors and Comorbidities  Comorbidity 2    Comorbidities  obesity, chronic low back and cervical pain    Examination-Activity Limitations  Dressing;Sit;Stand;Stairs;Hygiene/Grooming;Bathing;Squat    Examination-Participation Restrictions  Community Activity;Meal Prep;Driving    Stability/Clinical Decision Making  Evolving/Moderate complexity    Rehab Potential  Good    PT Frequency  2x / week    PT Duration  6 weeks    PT Treatment/Interventions  ADLs/Self Care Home Management;Cryotherapy;Electrical Stimulation;Moist Heat;Therapeutic exercise;Therapeutic activities;Functional mobility training;Neuromuscular re-education;Patient/family education;Manual techniques;Passive range of motion;Dry needling;Taping;Spinal Manipulations;Aquatic Therapy    PT Next Visit Plan  land PT    PT Home Exercise Plan  Access Code: [email or web address]    Consulted and Agree with Plan of Care  Patient       Patient will benefit from skilled therapeutic intervention in order to improve the following deficits and impairments:  Pain, Improper body mechanics, Postural dysfunction, Increased muscle spasms, Decreased activity tolerance, Decreased endurance, Decreased range of motion, Decreased strength, Impaired flexibility  Visit Diagnosis: Muscle weakness (generalized)  Chronic  bilateral low back pain, unspecified whether sciatica present  Cervicalgia     Problem List Patient Active Problem List   Diagnosis  Date Noted  . Anxiety 12/25/2017    COCHRAN,JENNIFER, PTA 11/07/2018, 4:32 PM  [facility] [address], [address] [place], [place], [address] Phone: [phone]   Fax:  [phone]  Name: Nathaniel Grant MRN: [id number] Date of Birth: 13-Nov-1960

## 2018-11-12 DIAGNOSIS — M47816 Spondylosis without myelopathy or radiculopathy, lumbar region: Secondary | ICD-10-CM | POA: Insufficient documentation

## 2018-11-14 ENCOUNTER — Encounter: Payer: Self-pay | Admitting: Physical Therapy

## 2018-11-14 ENCOUNTER — Ambulatory Visit: Payer: Medicaid Other | Admitting: Physical Therapy

## 2018-11-14 ENCOUNTER — Other Ambulatory Visit: Payer: Self-pay

## 2018-11-14 ENCOUNTER — Encounter

## 2018-11-14 DIAGNOSIS — M6281 Muscle weakness (generalized): Secondary | ICD-10-CM

## 2018-11-14 DIAGNOSIS — M542 Cervicalgia: Secondary | ICD-10-CM

## 2018-11-14 DIAGNOSIS — G8929 Other chronic pain: Secondary | ICD-10-CM

## 2018-11-14 NOTE — Therapy (Signed)
[facility] [address] [place], [place], [address] Phone: [phone]   Fax:  [phone]  Physical Therapy Treatment  Patient Details  Name: Nathaniel Grant MRN: [id number] Date of Birth: Jun 08, 1960 Referring Provider (PT): Rodell Perna, MD   Encounter Date: 11/14/2018  PT End of Session - 11/14/18 1651    Visit Number  25    Date for PT Re-Evaluation  12/17/18    Authorization Type  visits requested 11/05/2018    Authorization Time Period  08/18/18-09/28/18    Authorization - Visit Number  11    Authorization - Number of Visits  12    PT Start Time  1300    PT Stop Time  1345    PT Time Calculation (min)  45 min    Activity Tolerance  Patient tolerated treatment well    Behavior During Therapy  [facility] for tasks assessed/performed       Past Medical History:  Diagnosis Date  . Depression     History reviewed. No pertinent surgical history.  There were no vitals filed for this visit.  Subjective Assessment - 11/14/18 1650    Subjective  BAck feels better moving in the water. I hurt today but I know the water will help it.    Currently in Pain?  Yes    Pain Score  7     Pain Location  Back    Pain Orientation  Left;Lower    Pain Descriptors / Indicators  Sharp    Aggravating Factors   activity    Pain Relieving Factors  aquatic    Multiple Pain Sites  No       Treatment Aquatic Session Water Temp 86.7 degrees F Pt exited and entered the pool via long ramp with bil hand rails.  Seated: 75% submersion for AROM ankles, knees, hips with concurrent discussion of pain/current status and treatment goals for today.   Standing: water walking mid thoracic, 2 lengths with slight increase in water current to challenge LE. All 4 directions. PTA monitored pain. Black water weights ( thicker, more resistance) for rows and extensions 20x each Trunk rotation 2x10, used noodle for support for pt's back Decompression  float 2 min QL stretch 3x 20 sec bil with noodle behind pt for flotation.                           PT Short Term Goals - 09/04/18 1337      PT SHORT TERM GOAL #1   Title  be independent in initial HEP    Status  Achieved      PT SHORT TERM GOAL #2   Title  initiate a walking program to improve endurance for community function    Time  4    Period  Weeks    Status  On-going      PT SHORT TERM GOAL #3   Title  verbalize understanding of pain neuroscience education and initiate 1 strategy    Status  Achieved        PT Long Term Goals - 11/05/18 1240      PT LONG TERM GOAL #1   Title  be independent in advanced HEP    Time  6    Period  Weeks    Status  On-going    Target Date  12/17/18      PT LONG TERM GOAL #2   Title  reduce FOTO to <  or = to 59% limitation    Baseline  59%    Status  Achieved      PT LONG TERM GOAL #3   Title  perform regular walking for exercise > or = to 5 days a week    Baseline  going to the pool 1x/wk, pt took 2 walks this week    Time  6    Period  Weeks    Status  On-going    Target Date  12/17/18      PT LONG TERM GOAL #4   Title  report a 30% reduction in neck and lumbar pain with sitting and walking    Baseline  20%- improved mobility and strength    Time  6    Period  Weeks    Status  On-going    Target Date  12/17/18      PT LONG TERM GOAL #5   Title  demonstrate Rt and Lt cervical A/ROM rotation to > or = to 75 degrees to improve safety with driving    Baseline  85 and 80    Status  Achieved      PT LONG TERM GOAL #6   Title  improve lumbar and Rt shoulder flexibility to allow for indendence with self-care (wiping and cleaning)    Baseline  stretching at home and in the pool- improved flexibility but still having difficulty    Time  6    Period  Weeks    Status  On-going            Plan - 11/14/18 1652    Clinical Impression Statement  Pt arrives to aquatic therapy with reported LT low back  pain. By unweighting his spine in the water pt was able to move pain free and work on his mobility and strength without limitation. He has emailed our contact at the [facility] and is in the process of collaboarating with them for continued aquatic availability when he is discharged from our facility.    Personal Factors and Comorbidities  Comorbidity 2    Examination-Activity Limitations  Dressing;Sit;Stand;Stairs;Hygiene/Grooming;Bathing;Squat    Examination-Participation Restrictions  Community Activity;Meal Prep;Driving    Stability/Clinical Decision Making  Evolving/Moderate complexity    Rehab Potential  Good    PT Duration  6 weeks    PT Treatment/Interventions  ADLs/Self Care Home Management;Cryotherapy;Electrical Stimulation;Moist Heat;Therapeutic exercise;Therapeutic activities;Functional mobility training;Neuromuscular re-education;Patient/family education;Manual techniques;Passive range of motion;Dry needling;Taping;Spinal Manipulations;Aquatic Therapy    PT Next Visit Plan  land PT    PT Home Exercise Plan  Access Code: [email or web address]    Consulted and Agree with Plan of Care  Patient       Patient will benefit from skilled therapeutic intervention in order to improve the following deficits and impairments:  Pain, Improper body mechanics, Postural dysfunction, Increased muscle spasms, Decreased activity tolerance, Decreased endurance, Decreased range of motion, Decreased strength, Impaired flexibility  Visit Diagnosis: Muscle weakness (generalized)  Chronic bilateral low back pain, unspecified whether sciatica present  Cervicalgia     Problem List Patient Active Problem List   Diagnosis Date Noted  . Anxiety 12/25/2017    COCHRAN,JENNIFER, PTA 11/14/2018, 4:57 PM  [facility] [address] [place], [place], [address] Phone: [phone]   Fax:  [phone]  Name: Nathaniel Grant MRN: [id number] Date of Birth:  1960/02/15

## 2018-11-17 ENCOUNTER — Ambulatory Visit: Payer: Medicaid Other | Admitting: Physical Therapy

## 2018-11-19 ENCOUNTER — Other Ambulatory Visit: Payer: Self-pay

## 2018-11-19 ENCOUNTER — Ambulatory Visit: Payer: Medicaid Other | Admitting: Physical Therapy

## 2018-11-19 ENCOUNTER — Encounter: Payer: Self-pay | Admitting: Physical Therapy

## 2018-11-19 DIAGNOSIS — M6281 Muscle weakness (generalized): Secondary | ICD-10-CM

## 2018-11-19 DIAGNOSIS — M545 Low back pain, unspecified: Secondary | ICD-10-CM

## 2018-11-19 DIAGNOSIS — G8929 Other chronic pain: Secondary | ICD-10-CM

## 2018-11-19 DIAGNOSIS — M542 Cervicalgia: Secondary | ICD-10-CM

## 2018-11-19 NOTE — Therapy (Signed)
[facility] [address], [address] [place], [place], [address] Phone: [phone]   Fax:  [phone]  Physical Therapy Treatment  Patient Details  Name: Nathaniel Grant MRN: [id number] Date of Birth: [birth year] Referring Provider (PT): Nathaniel Greening, MD   Encounter Date: 11/19/2018  PT End of Session - 11/19/18 1449    Visit Number  26    Date for PT Re-Evaluation  12/17/18    Authorization Type  visits requested 11/05/2018    Authorization Time Period  11/09/2000-12/21/2018    Authorization - Visit Number  2    Authorization - Number of Visits  7    PT Start Time  1445    PT Stop Time  1525    PT Time Calculation (min)  40 min    Activity Tolerance  Patient tolerated treatment well    Behavior During Therapy  [facility] for tasks assessed/performed       Past Medical History:  Diagnosis Date  . Depression     History reviewed. No pertinent surgical history.  There were no vitals filed for this visit.  Subjective Assessment - 11/19/18 1451    Subjective  My left leg is hurting today, from my hip to the knee.    Pertinent History  chronic cervical and lumbar pain, diverticulitis, depression    Diagnostic tests  Pt had x-ray: C5-6 and C6-7 facet arthropahty and spurring.  Lumbar spondylosis: L2-3, L5-S1.    Currently in Pain?  Yes    Pain Score  7     Pain Location  --   Lt hip to knee   Pain Orientation  Left    Pain Descriptors / Indicators  Radiating    Multiple Pain Sites  No                       [facility] Adult PT Treatment/Exercise - 11/19/18 0001      Lumbar Exercises: Stretches   Single Knee to Chest Stretch  Left   10x with yoga strap for assistance, AROM 10x   Lower Trunk Rotation  5 reps   2x     Lumbar Exercises: Aerobic   Nustep  level 5, seat #11, arm #11, for 7 minutes- Program 5, pt has LTLE pain today that limited him, having to stop at 7 min   did at an even pace; Profile 5     Lumbar  Exercises: Seated   Sit to Stand Limitations  blue ball rolls forward and diagonals x 10 each      Lumbar Exercises: Supine   Other Supine Lumbar Exercises  Lt leg lengthener stretch 5 sec 5-8x               PT Short Term Goals - 09/04/18 1337      PT SHORT TERM GOAL #1   Title  be independent in initial HEP    Status  Achieved      PT SHORT TERM GOAL #2   Title  initiate a walking program to improve endurance for community function    Time  4    Period  Weeks    Status  On-going      PT SHORT TERM GOAL #3   Title  verbalize understanding of pain neuroscience education and initiate 1 strategy    Status  Achieved        PT Long Term Goals - 11/05/18 1240      PT LONG TERM GOAL #1  Title  be independent in advanced HEP    Time  6    Period  Weeks    Status  On-going    Target Date  12/17/18      PT LONG TERM GOAL #2   Title  reduce FOTO to < or = to 59% limitation    Baseline  59%    Status  Achieved      PT LONG TERM GOAL #3   Title  perform regular walking for exercise > or = to 5 days a week    Baseline  going to the pool 1x/wk, pt took 2 walks this week    Time  6    Period  Weeks    Status  On-going    Target Date  12/17/18      PT LONG TERM GOAL #4   Title  report a 30% reduction in neck and lumbar pain with sitting and walking    Baseline  20%- improved mobility and strength    Time  6    Period  Weeks    Status  On-going    Target Date  12/17/18      PT LONG TERM GOAL #5   Title  demonstrate Rt and Lt cervical A/ROM rotation to > or = to 75 degrees to improve safety with driving    Baseline  85 and 80    Status  Achieved      PT LONG TERM GOAL #6   Title  improve lumbar and Rt shoulder flexibility to allow for indendence with self-care (wiping and cleaning)    Baseline  stretching at home and in the pool- improved flexibility but still having difficulty    Time  6    Period  Weeks    Status  On-going            Plan - 11/19/18  1452    Clinical Impression Statement  Pt arrives with complaints of LT hip to knee pain today. This pain limited his time on the Nustep today. We focused mainly on hip and low back stretches during todays session.  Pt reported this was some helpful.    Personal Factors and Comorbidities  Comorbidity 2    Comorbidities  obesity, chronic low back and cervical pain    Examination-Activity Limitations  Dressing;Sit;Stand;Stairs;Hygiene/Grooming;Bathing;Squat    Examination-Participation Restrictions  Community Activity;Meal Prep;Driving    Stability/Clinical Decision Making  Evolving/Moderate complexity    Rehab Potential  Good    PT Frequency  2x / week    PT Duration  6 weeks    PT Treatment/Interventions  ADLs/Self Care Home Management;Cryotherapy;Electrical Stimulation;Moist Heat;Therapeutic exercise;Therapeutic activities;Functional mobility training;Neuromuscular re-education;Patient/family education;Manual techniques;Passive range of motion;Dry needling;Taping;Spinal Manipulations;Aquatic Therapy    PT Next Visit Plan  Aquatics    PT Home Exercise Plan  Access Code: [email or web address]    Consulted and Agree with Plan of Care  Patient       Patient will benefit from skilled therapeutic intervention in order to improve the following deficits and impairments:  Pain, Improper body mechanics, Postural dysfunction, Increased muscle spasms, Decreased activity tolerance, Decreased endurance, Decreased range of motion, Decreased strength, Impaired flexibility  Visit Diagnosis: Muscle weakness (generalized)  Chronic bilateral low back pain, unspecified whether sciatica present  Cervicalgia     Problem List Patient Active Problem List   Diagnosis Date Noted  . Anxiety 12/25/2017    ,, PTA 11/19/2018, 3:25 PM  [facility] [address],  [address] [place], [place], [address] Phone: [phone]   Fax:  [phone]  Name:  Nathaniel Grant MRN: [id number] Date of Birth: 1960/09/12

## 2018-11-26 ENCOUNTER — Encounter: Payer: Medicaid Other | Admitting: Physical Therapy

## 2018-11-28 ENCOUNTER — Ambulatory Visit: Payer: Medicaid Other | Admitting: Physical Therapy

## 2018-11-28 ENCOUNTER — Other Ambulatory Visit: Payer: Self-pay

## 2018-11-28 ENCOUNTER — Encounter: Payer: Self-pay | Admitting: Physical Therapy

## 2018-11-28 DIAGNOSIS — M542 Cervicalgia: Secondary | ICD-10-CM

## 2018-11-28 DIAGNOSIS — M6281 Muscle weakness (generalized): Secondary | ICD-10-CM

## 2018-11-28 DIAGNOSIS — M545 Low back pain, unspecified: Secondary | ICD-10-CM

## 2018-11-28 DIAGNOSIS — G8929 Other chronic pain: Secondary | ICD-10-CM

## 2018-11-28 NOTE — Therapy (Signed)
[facility] [address], [address] [place], [place], [address] Phone: [phone]   Fax:  [phone]  Physical Therapy Treatment  Patient Details  Name: Nathaniel SorensonRichard Labus MRN: [id number] Date of Birth: [birth year] Referring Provider (PT): Annell GreeningYates, Mark, MD   Encounter Date: 11/28/2018  PT End of Session - 11/28/18 1548    Visit Number  27    Date for PT Re-Evaluation  12/17/18    Authorization Type  visits requested 11/05/2018    Authorization Time Period  11/09/2000-12/21/2018    Authorization - Visit Number  3    Authorization - Number of Visits  7    PT Start Time  1305    PT Stop Time  1350    PT Time Calculation (min)  45 min    Activity Tolerance  Patient tolerated treatment well    Behavior During Therapy  [facility]/performed       Past Medical History:  Diagnosis Date  . Depression     History reviewed. No pertinent surgical history.  There were no vitals filed for this visit.  Subjective Assessment - 11/28/18 1545    Subjective  My LT back pain seems to be more sharp at times. 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  Lumbar spondylosis: L2-3, L5-S1.    Currently in Pain?  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    Multiple Pain Sites  No       Treatment: Aquatic session: Water temperature 86.7 degrees F Pt enters and exits pool via long ramp with 2 hand rails.  Seated: 75% submersion ankle, knee, hip AROM 20x each with concurrent discussion  of his pain and overall current status.  Seated hamstring stretch Bil 2x 30 sec, added ankle DF/PF 10x on second set.  Water walking mid thoracic depth 4 lengths of each direction with 30 sec-1 min rest break in between direction changes to left back rest/decompress.  Long leg stretch bil 3x 10 sec with noodle behind pt. UE weights ( black foam ) 20x arms in each direction  10 min intermittent decompression float and intermittent bicycle ( neck limits) with noodle behind.                           PT Short Term Goals - 09/04/18 1337      PT SHORT TERM GOAL #1   Title  be independent in initial HEP    Status  Achieved      PT SHORT TERM GOAL #2   Title  initiate a walking program to improve endurance for community function    Time  4    Period  Weeks    Status  On-going      PT SHORT TERM GOAL #3   Title  verbalize understanding of pain neuroscience education and initiate 1 strategy  Status  Achieved        PT Long Term Goals - 11/05/18 1240      PT LONG TERM GOAL #1   Title  be independent in advanced HEP    Time  6    Period  Weeks    Status  On-going    Target Date  12/17/18      PT LONG TERM GOAL #2   Title  reduce FOTO to < or = to 59% limitation    Baseline  59%    Status  Achieved      PT LONG TERM GOAL #3   Title  perform regular walking for exercise > or = to 5 days a week    Baseline  going to the pool 1x/wk, pt took 2 walks this week    Time  6    Period  Weeks    Status  On-going    Target Date  12/17/18      PT LONG TERM GOAL #4   Title  report a 30% reduction in neck and lumbar pain with sitting and walking    Baseline  20%- improved mobility and strength    Time  6    Period  Weeks    Status  On-going    Target Date  12/17/18      PT LONG TERM GOAL #5   Title  demonstrate Rt and Lt cervical A/ROM rotation to > or = to 75 degrees to improve safety with driving    Baseline  85 and 80    Status  Achieved      PT LONG  TERM GOAL #6   Title  improve lumbar and Rt shoulder flexibility to allow for indendence with self-care (wiping and cleaning)    Baseline  stretching at home and in the pool- improved flexibility but still having difficulty    Time  6    Period  Weeks    Status  On-going            Plan - 11/28/18 1549    Clinical Impression Statement  Pt arrives with complaints of LT low back pain. He is able to exercise in the water consistently with less pain and often, but not every time, abolishes his pain for some time. Pt continues to be in discussion with [employer] representative to hopefully be able to continue water exercising,    Personal Factors and Comorbidities  Comorbidity 2    Comorbidities  obesity, chronic low back and cervical pain    Examination-Activity Limitations  Dressing;Sit;Stand;Stairs;Hygiene/Grooming;Bathing;Squat    Examination-Participation Restrictions  Community Activity;Meal Prep;Driving    Stability/Clinical Decision Making  Evolving/Moderate complexity    Rehab Potential  Good    PT Frequency  2x / week    PT Duration  6 weeks    PT Treatment/Interventions  ADLs/Self Care Home Management;Cryotherapy;Electrical Stimulation;Moist Heat;Therapeutic exercise;Therapeutic activities;Functional mobility training;Neuromuscular re-education;Patient/family education;Manual techniques;Passive range of motion;Dry needling;Taping;Spinal Manipulations;Aquatic Therapy    PT Next Visit Plan  Pt will continue with aquatic PT 1x week    PT Home Exercise Plan  Access Code: [email or web address]    Consulted and Agree with Plan of Care  Patient       Patient will benefit from skilled therapeutic intervention in order to improve the following deficits and impairments:  Pain, Improper body mechanics, Postural dysfunction, Increased muscle spasms, Decreased activity tolerance, Decreased endurance, Decreased range of motion, Decreased strength, Impaired flexibility  Visit Diagnosis: Muscle weakness  (generalized)  Chronic  bilateral low back pain, unspecified whether sciatica present  Cervicalgia     Problem List Patient Active Problem List   Diagnosis Date Noted  . Anxiety 12/25/2017    Madigan Rosensteel,PTA 11/28/2018, 3:54 PM   [facility] [address] [place], [place], [address] Phone: [phone]   Fax:  [phone]  Name: Lanorris Kalisz MRN: [id number] Date of Birth: 1960-05-23

## 2018-12-03 ENCOUNTER — Encounter: Payer: Medicaid Other | Admitting: Physical Therapy

## 2018-12-05 ENCOUNTER — Other Ambulatory Visit: Payer: Self-pay

## 2018-12-05 ENCOUNTER — Ambulatory Visit: Payer: Medicaid Other | Admitting: Physical Therapy

## 2018-12-05 ENCOUNTER — Encounter: Payer: Self-pay | Admitting: Physical Therapy

## 2018-12-05 DIAGNOSIS — M545 Low back pain, unspecified: Secondary | ICD-10-CM

## 2018-12-05 DIAGNOSIS — M542 Cervicalgia: Secondary | ICD-10-CM

## 2018-12-05 DIAGNOSIS — M6281 Muscle weakness (generalized): Secondary | ICD-10-CM

## 2018-12-05 DIAGNOSIS — G8929 Other chronic pain: Secondary | ICD-10-CM

## 2018-12-05 NOTE — Therapy (Signed)
[facility] [address], [address] [place], [place], [address] Phone: [phone]   Fax:  [phone]  Physical Therapy Treatment  Patient Details  Name: Nathaniel Grant MRN: [id number] Date of Birth: 1960/11/12 Referring Provider (PT): Annell Greening, MD   Encounter Date: 12/05/2018  PT End of Session - 12/05/18 1614    Visit Number  28    Date for PT Re-Evaluation  12/17/18    Authorization Type  visits requested 11/05/2018    Authorization Time Period  11/09/2000-12/21/2018    Authorization - Visit Number  4    Authorization - Number of Visits  7    PT Start Time  1400   Pt 15 min late   PT Stop Time  1445    PT Time Calculation (min)  45 min    Activity Tolerance  Patient tolerated treatment well    Behavior During Therapy  [facility] for tasks assessed/performed       Past Medical History:  Diagnosis Date  . Depression     History reviewed. No pertinent surgical history.  There were no vitals filed for this visit.  Subjective Assessment - 12/05/18 1620    Subjective  No new complaints, pt did not mention any pain.    Pertinent History  chronic cervical and lumbar pain, diverticulitis, depression    Currently in Pain?  Other (Comment)   Ddid not formally assess      Treatment: Aquatic session Water temperature 86.7 degree F Pt entered and exited pool via long ramp with Bil hand rails  Water walking mid trunk depth. VC to really increase his speed to a point where he was working harder, maybe breathing a little harder but not too much pain. Pt could perform 2 lengths of each direction with strong current.  Decompression float post walking, pt fatigued x 5 min with gentle lower trunk stretching bil   High knee marching with noodle press for lat activation 2 lengths of pool mid trunk depth.  Black UE water weights for rows, extensions 20x2 sets each full submersion  Post pelvic tilt against pool wall in mini squat  with noodle press for core activation. Hold 3 sec 10x                          PT Short Term Goals - 09/04/18 1337      PT SHORT TERM GOAL #1   Title  be independent in initial HEP    Status  Achieved      PT SHORT TERM GOAL #2   Title  initiate a walking program to improve endurance for community function    Time  4    Period  Weeks    Status  On-going      PT SHORT TERM GOAL #3   Title  verbalize understanding of pain neuroscience education and initiate 1 strategy    Status  Achieved        PT Long Term Goals - 11/05/18 1240      PT LONG TERM GOAL #1   Title  be independent in advanced HEP    Time  6    Period  Weeks    Status  On-going    Target Date  12/17/18      PT LONG TERM GOAL #2   Title  reduce FOTO to < or = to 59% limitation    Baseline  59%    Status  Achieved  PT LONG TERM GOAL #3   Title  perform regular walking for exercise > or = to 5 days a week    Baseline  going to the pool 1x/wk, pt took 2 walks this week    Time  6    Period  Weeks    Status  On-going    Target Date  12/17/18      PT LONG TERM GOAL #4   Title  report a 30% reduction in neck and lumbar pain with sitting and walking    Baseline  20%- improved mobility and strength    Time  6    Period  Weeks    Status  On-going    Target Date  12/17/18      PT LONG TERM GOAL #5   Title  demonstrate Rt and Lt cervical A/ROM rotation to > or = to 75 degrees to improve safety with driving    Baseline  85 and 80    Status  Achieved      PT LONG TERM GOAL #6   Title  improve lumbar and Rt shoulder flexibility to allow for indendence with self-care (wiping and cleaning)    Baseline  stretching at home and in the pool- improved flexibility but still having difficulty    Time  6    Period  Weeks    Status  On-going            Plan - 12/05/18 1615    Clinical Impression Statement  Pt arrived late so we headed right into treatment without much discussion  about his pain. Pt gave excellent effort in the pool increasing his walking speed and generating a large current that inherently increases his resistance. He still required intermittent decompression breaks due to poor endurance, but overall excellent effort today,    Personal Factors and Comorbidities  Comorbidity 2    Comorbidities  obesity, chronic low back and cervical pain    Examination-Activity Limitations  Dressing;Sit;Stand;Stairs;Hygiene/Grooming;Bathing;Squat    Examination-Participation Restrictions  Community Activity;Meal Prep;Driving    Stability/Clinical Decision Making  Evolving/Moderate complexity    Rehab Potential  Good    PT Frequency  2x / week    PT Duration  6 weeks    PT Treatment/Interventions  ADLs/Self Care Home Management;Cryotherapy;Electrical Stimulation;Moist Heat;Therapeutic exercise;Therapeutic activities;Functional mobility training;Neuromuscular re-education;Patient/family education;Manual techniques;Passive range of motion;Dry needling;Taping;Spinal Manipulations;Aquatic Therapy    PT Next Visit Plan  2 visits next week, 1 land, 1 water    PT Home Exercise Plan  Access Code: [email or web address]    Consulted and Agree with Plan of Care  Patient       Patient will benefit from skilled therapeutic intervention in order to improve the following deficits and impairments:  Pain, Improper body mechanics, Postural dysfunction, Increased muscle spasms, Decreased activity tolerance, Decreased endurance, Decreased range of motion, Decreased strength, Impaired flexibility  Visit Diagnosis: Muscle weakness (generalized)  Chronic bilateral low back pain, unspecified whether sciatica present  Cervicalgia     Problem List Patient Active Problem List   Diagnosis Date Noted  . Anxiety 12/25/2017    Maleeah Crossman, PTA 12/05/2018, 4:21 PM  [facility] [address] [place], [place], [address] Phone:  [phone]   Fax:  [phone]  Name: Abdulkarim Eberlin MRN: [id number] Date of Birth: 1960-05-02

## 2018-12-10 ENCOUNTER — Ambulatory Visit: Payer: Medicaid Other

## 2018-12-12 ENCOUNTER — Other Ambulatory Visit: Payer: Self-pay

## 2018-12-12 ENCOUNTER — Ambulatory Visit: Payer: Medicaid Other | Attending: Orthopaedic Surgery | Admitting: Physical Therapy

## 2018-12-12 ENCOUNTER — Encounter: Payer: Self-pay | Admitting: Physical Therapy

## 2018-12-12 DIAGNOSIS — M545 Low back pain: Secondary | ICD-10-CM | POA: Diagnosis present

## 2018-12-12 DIAGNOSIS — M542 Cervicalgia: Secondary | ICD-10-CM

## 2018-12-12 DIAGNOSIS — G8929 Other chronic pain: Secondary | ICD-10-CM

## 2018-12-12 DIAGNOSIS — M6281 Muscle weakness (generalized): Secondary | ICD-10-CM

## 2018-12-12 NOTE — Therapy (Signed)
[facility] [address] [place], [place], [address] Phone: [phone]   Fax:  [phone]  Physical Therapy Treatment  Patient Details  Name: Nathaniel Grant MRN: [id number] Date of Birth: 1960/07/26 Referring Provider (PT): Annell Greening, MD   Encounter Date: 12/12/2018  PT End of Session - 12/12/18 1515    Visit Number  29    Date for PT Re-Evaluation  12/17/18    Authorization Type  visits requested 11/05/2018    Authorization Time Period  11/09/2000-12/21/2018    Authorization - Visit Number  5    Authorization - Number of Visits  7    PT Start Time  1310    PT Stop Time  1400    PT Time Calculation (min)  50 min    Activity Tolerance  Patient tolerated treatment well    Behavior During Therapy  [facility] for tasks assessed/performed       Past Medical History:  Diagnosis Date  . Depression     History reviewed. No pertinent surgical history.  There were no vitals filed for this visit.  Subjective Assessment - 12/12/18 1513    Subjective  I am communicating via email with the [facility]. I am starting in a program there next week or the week after.    Pertinent History  chronic cervical and lumbar pain, diverticulitis, depression    Currently in Pain?  --   Did not assess pain/pt has chronic pain     Treatment: Aquatic session Water temp 86.7 degrees F Pt entered and exited the pool via long ramp using Bil hand rails  Seated: 75% submersion for ankle, knee, hip AROM and concurrent discussion about transitioning to the [facility] PREP program.  Mid trunk water walking with emphasis on creating current for increased resistance. 2 lengths in each direction.   High knee marching with noodle press for lat activation: 2 lengths of pool.  Decompression float x 5 min with Vc to let tailbone hang.                           PT Short Term Goals - 09/04/18 1337      PT SHORT TERM GOAL #1   Title  be  independent in initial HEP    Status  Achieved      PT SHORT TERM GOAL #2   Title  initiate a walking program to improve endurance for community function    Time  4    Period  Weeks    Status  On-going      PT SHORT TERM GOAL #3   Title  verbalize understanding of pain neuroscience education and initiate 1 strategy    Status  Achieved        PT Long Term Goals - 11/05/18 1240      PT LONG TERM GOAL #1   Title  be independent in advanced HEP    Time  6    Period  Weeks    Status  On-going    Target Date  12/17/18      PT LONG TERM GOAL #2   Title  reduce FOTO to < or = to 59% limitation    Baseline  59%    Status  Achieved      PT LONG TERM GOAL #3   Title  perform regular walking for exercise > or = to 5 days a week    Baseline  going to  the pool 1x/wk, pt took 2 walks this week    Time  6    Period  Weeks    Status  On-going    Target Date  12/17/18      PT LONG TERM GOAL #4   Title  report a 30% reduction in neck and lumbar pain with sitting and walking    Baseline  20%- improved mobility and strength    Time  6    Period  Weeks    Status  On-going    Target Date  12/17/18      PT LONG TERM GOAL #5   Title  demonstrate Rt and Lt cervical A/ROM rotation to > or = to 75 degrees to improve safety with driving    Baseline  85 and 80    Status  Achieved      PT LONG TERM GOAL #6   Title  improve lumbar and Rt shoulder flexibility to allow for indendence with self-care (wiping and cleaning)    Baseline  stretching at home and in the pool- improved flexibility but still having difficulty    Time  6    Period  Weeks    Status  On-going            Plan - 12/12/18 1516    Clinical Impression Statement  Pt arrives to aquatic PTwith reports he has been communicating with the [employer] about joining their PREP program that we are hopfull will include an aquatic portion. Pt has a date set up for what he calls an "intake" at the [facility] in the next 2 weeks. He can  exercise in the pool with significantly less pain and improved mobility. Pt has knowledge to perform his own individual program or enter a class if he chooses.    Personal Factors and Comorbidities  Comorbidity 2    Comorbidities  obesity, chronic low back and cervical pain    Examination-Activity Limitations  Dressing;Sit;Stand;Stairs;Hygiene/Grooming;Bathing;Squat    Examination-Participation Restrictions  Community Activity;Meal Prep;Driving    Stability/Clinical Decision Making  Evolving/Moderate complexity    Rehab Potential  Good    PT Frequency  2x / week    PT Duration  6 weeks    PT Treatment/Interventions  ADLs/Self Care Home Management;Cryotherapy;Electrical Stimulation;Moist Heat;Therapeutic exercise;Therapeutic activities;Functional mobility training;Neuromuscular re-education;Patient/family education;Manual techniques;Passive range of motion;Dry needling;Taping;Spinal Manipulations;Aquatic Therapy    PT Next Visit Plan  ERO visit next. PTA recommends DC to [facility] Prep program    PT Home Exercise Plan  Access Code: [email or web address]    Consulted and Agree with Plan of Care  Patient       Patient will benefit from skilled therapeutic intervention in order to improve the following deficits and impairments:  Pain, Improper body mechanics, Postural dysfunction, Increased muscle spasms, Decreased activity tolerance, Decreased endurance, Decreased range of motion, Decreased strength, Impaired flexibility  Visit Diagnosis: Muscle weakness (generalized)  Chronic bilateral low back pain, unspecified whether sciatica present  Cervicalgia     Problem List Patient Active Problem List   Diagnosis Date Noted  . Anxiety 12/25/2017    Aurianna Earlywine, PTA 12/12/2018, 3:26 PM  [facility] [address] [place], [place], [address] Phone: [phone]   Fax:  [phone]  Name: Nathaniel Grant MRN: [id number] Date of Birth:  [birth year]

## 2018-12-17 ENCOUNTER — Encounter: Payer: Medicaid Other | Admitting: Physical Therapy

## 2018-12-17 ENCOUNTER — Ambulatory Visit: Payer: Medicaid Other | Admitting: Physical Therapy

## 2018-12-17 ENCOUNTER — Other Ambulatory Visit: Payer: Self-pay

## 2018-12-17 ENCOUNTER — Encounter: Payer: Self-pay | Admitting: Physical Therapy

## 2018-12-17 DIAGNOSIS — M6281 Muscle weakness (generalized): Secondary | ICD-10-CM | POA: Diagnosis not present

## 2018-12-17 DIAGNOSIS — M542 Cervicalgia: Secondary | ICD-10-CM

## 2018-12-17 DIAGNOSIS — G8929 Other chronic pain: Secondary | ICD-10-CM

## 2018-12-17 DIAGNOSIS — M545 Low back pain, unspecified: Secondary | ICD-10-CM

## 2018-12-17 NOTE — Therapy (Signed)
[facility] [address] [place], [place], [address] Phone: [phone]   Fax:  [phone]  Physical Therapy Treatment  Patient Details  Name: Nathaniel Grant MRN: [id number] Date of Birth: 1960/08/08 Referring Provider (PT): Rodell Perna, MD   Encounter Date: 12/17/2018  PT End of Session - 12/17/18 1604    Visit Number  30    Date for PT Re-Evaluation  12/17/18    Authorization - Visit Number  6    Authorization - Number of Visits  7    PT Start Time  5320    PT Stop Time  1610    PT Time Calculation (min)  40 min    Activity Tolerance  Patient tolerated treatment well    Behavior During Therapy  [facility] for tasks assessed/performed       Past Medical History:  Diagnosis Date  . Depression     History reviewed. No pertinent surgical history.  There were no vitals filed for this visit.  Subjective Assessment - 12/17/18 1531    Subjective  I am suppose to go for the intake at the [facility]. I stopped the medication for 2 days to see where I am at.    Pertinent History  chronic cervical and lumbar pain, diverticulitis, depression    Limitations  Standing;Sitting;Walking    How long can you sit comfortably?  30 minutes max    How long can you stand comfortably?  very limited < 5 minutes    How long can you walk comfortably?  walking limited in the community- 10 minutes limitation    Diagnostic tests  Pt had x-ray: C5-6 and C6-7 facet arthropahty and spurring.  Lumbar spondylosis: L2-3, L5-S1.    Patient Stated Goals  reduce pain, sit/stand/walk longer    Currently in Pain?  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    Pain Relieving Factors  moving in the pool    Multiple Pain Sites   Yes    Pain Score  8    Pain Location  Neck    Pain Orientation  Right;Left    Pain Descriptors / Indicators  Aching;Tightness    Pain Type  Chronic pain    Pain Onset  More than a month ago    Pain Frequency  Constant    Aggravating Factors   turning head    Pain Relieving Factors  change of position         [facility] PT Assessment - 12/17/18 0001      Assessment   Medical Diagnosis  chronic bilateral low back pain, neck pain    Referring Provider (PT)  Rodell Perna, MD    Hand Dominance  Left    Next MD Visit  unknown      Precautions   Precautions  None      Restrictions   Weight Bearing Restrictions  No      [place] residence    Living Arrangements  Other relatives      Cognition   Overall Cognitive Status  Within Functional Limits for  tasks assessed      Observation/Other Assessments   Focus on Therapeutic Outcomes (FOTO)   72% limitation      AROM   Overall AROM   Deficits    Overall AROM Comments  lumbar ROM is full    Cervical - Right Rotation  50    Cervical - Left Rotation  55      Strength   Overall Strength Comments  Lt UE 4/5, Rt UE 4+/5, LEs: knees 4+/5, hips 4-/5                   [facility] Adult PT Treatment/Exercise - 12/17/18 0001      Lumbar Exercises: Standing   Other Standing Lumbar Exercises  walking in reverse: 10# x 15   cues for abdominal bracing x10     Lumbar Exercises: Seated   Sit to Stand Limitations  blue ball rolls forward and diagonals x 10 each               PT Short Term Goals - 09/04/18 1337      PT SHORT TERM GOAL #1   Title  be independent in initial HEP    Status  Achieved      PT SHORT TERM GOAL #2   Title  initiate a walking program to improve endurance for community function    Time  4    Period  Weeks    Status  On-going      PT SHORT TERM GOAL #3   Title  verbalize understanding of pain neuroscience education and initiate 1 strategy    Status  Achieved         PT Long Term Goals - 12/17/18 1549      PT LONG TERM GOAL #1   Title  be independent in advanced HEP    Time  6    Period  Weeks    Status  Achieved      PT LONG TERM GOAL #2   Title  reduce FOTO to < or = to 59% limitation    Time  6    Period  Weeks    Status  Achieved      PT LONG TERM GOAL #3   Title  perform regular walking for exercise > or = to 5 days a week    Baseline  2 days per week    Time  6    Period  Weeks    Status  Partially Met      PT LONG TERM GOAL #4   Title  report a 30% reduction in neck and lumbar pain with sitting and walking    Baseline  20%- improved mobility and strength    Time  6    Period  Weeks    Status  Partially Met      PT LONG TERM GOAL #5   Title  demonstrate Rt and Lt cervical A/ROM rotation to > or = to 75 degrees to improve safety with driving    Time  8    Period  Weeks    Status  Achieved      PT LONG TERM GOAL #6   Title  improve lumbar and Rt shoulder flexibility to allow for indendence with self-care (wiping and cleaning)    Time  6    Period  Weeks    Status  Achieved            Plan - 12/17/18 1605    Clinical  Impression Statement  Patient is signing up with the PREP program with progression to the aquatics after 12 weeks and hopefully get a scholarship. Patient has increased lumbar ROM. Patient cervical ROM is decreased due to being in more pain since he has not taken his medication in several days. Patient was able to do the workout program without stopping due to pain. Patient did very well in the pool when he is unweighted and felt like  he gets more of a workout. Patient is ready for discharge.    Personal Factors and Comorbidities  Comorbidity 2    Comorbidities  obesity, chronic low back and cervical pain    Examination-Activity Limitations  Dressing;Sit;Stand;Stairs;Hygiene/Grooming;Bathing;Squat    Examination-Participation Restrictions  Community Activity;Meal Prep;Driving    Stability/Clinical  Decision Making  Evolving/Moderate complexity    PT Treatment/Interventions  ADLs/Self Care Home Management;Cryotherapy;Electrical Stimulation;Moist Heat;Therapeutic exercise;Therapeutic activities;Functional mobility training;Neuromuscular re-education;Patient/family education;Manual techniques;Passive range of motion;Dry needling;Taping;Spinal Manipulations;Aquatic Therapy    PT Next Visit Plan  Discharge to HEP    PT Home Exercise Plan  Access Code: [email or web address]    Consulted and Agree with Plan of Care  Patient       Patient will benefit from skilled therapeutic intervention in order to improve the following deficits and impairments:  Pain, Improper body mechanics, Postural dysfunction, Increased muscle spasms, Decreased activity tolerance, Decreased endurance, Decreased range of motion, Decreased strength, Impaired flexibility  Visit Diagnosis: Muscle weakness (generalized)  Chronic bilateral low back pain, unspecified whether sciatica present  Cervicalgia     Problem List Patient Active Problem List   Diagnosis Date Noted  . Anxiety 12/25/2017    Earlie Counts, PT 12/17/18 4:09 PM   [facility] [address] [place], [place], [address] Phone: [phone]   Fax:  [phone]  Name: Nathaniel Grant MRN: [id number] Date of Birth: 1960/11/26  PHYSICAL THERAPY DISCHARGE SUMMARY  Visits from Start of Care: 30  Current functional level related to goals / functional outcomes: See above.   Remaining deficits: See above.    Education / Equipment: HEP Plan: Patient agrees to discharge.  Patient goals were partially met. Patient is being discharged due to meeting the stated rehab goals.  Thank you for the referral.Areen Trautner Pearline Cables, PT 12/17/18 4:10 PM  ?????

## 2018-12-19 ENCOUNTER — Encounter: Payer: Medicaid Other | Admitting: Physical Therapy

## 2018-12-24 ENCOUNTER — Telehealth: Payer: Self-pay

## 2018-12-24 NOTE — Telephone Encounter (Signed)
Referred to PREP.  Will start 11/30 M/W 1-215pm  Intake 11/17 at 11am.

## 2018-12-26 NOTE — Progress Notes (Signed)
[facility] Report   Patient Details  Name: Nathaniel Grant MRN: [id number] Date of Birth: 02-26-1960 Age: 58 y.o. PCP: Scot Jun, FNP  Vitals:   12/26/18 1244  BP: (!) 158/100  Pulse: 76  Resp: 20  SpO2: 96%  Weight: (!) 380 lb 9.6 oz (172.6 kg)  Height: 6' (1.829 m)     [facility] Eval - 12/26/18 1200      Referral    Referring Provider  Physical Therapy    Reason for referral  Obesitity/Overweight    Program Start Date  01/12/27      Measurement   Neck measurement  20.5 Inches    Waist Circumference  60 inches    Body fat  43.8 percent      Information for Trainer   Goals  --   By end of PREP, exercise 5x per week, lose weight   Current Exercise  none    Orthopedic Concerns  Gen pain   Back, left shoulder, neck pain. arthritis    Pertinent Medical History  HTN   has many risk factors for DM and CAD    Current Barriers  none other than pain    Medications that affect exercise  Medication causing dizziness/drowsiness      Timed Up and Go (TUGS)   Timed Up and Go  High risk >13 seconds      Mobility and Daily Activities   I find it easy to walk up or down two or more flights of stairs.  1    I have no trouble taking out the trash.  2    I do housework such as vacuuming and dusting on my own without difficulty.  1    I can easily lift a gallon of milk (8lbs).  4    I can easily walk a mile.  1    I have no trouble reaching into high cupboards or reaching down to pick up something from the floor.  1    I do not have trouble doing out-door work such as Armed forces logistics/support/administrative officer, raking leaves, or gardening.  1      Mobility and Daily Activities   I feel younger than my age.  1    I feel independent.  1    I feel energetic.  1    I live an active life.   1    I feel strong.  2    I feel healthy.  2    I feel active as other people my age.  1      How fit and strong are you.   Fit and Strong Total Score  20      Past Medical History:   Diagnosis Date  . Depression    No past surgical history on file. Social History   Tobacco Use  Smoking Status Never Smoker  Smokeless Tobacco Never Used    Timithy will begin 01/12/2019 every M/W 230p to 345pm for cardio and strength training. He prefers the pool for cardio.     Barnett Hatter 12/26/2018, 12:53 PM

## 2018-12-29 NOTE — H&P (Signed)
HPI:   Nathaniel Grant is a 58 y.o. male who presents as a consult Patient.   Referring Provider: Sullivan Grant*  Chief complaint: Respiratory papillomatosis.  HPI: He moved here from [facility] a few months ago. 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 He has had multiple procedures. His most recent one was about 2 years ago and he has been unable to seeing ever since. He has now moved here to be with family. He is starting to have a slight noise to his breathing and worsening voice. He was told to procedures ago that he might have some subglottic involvement. He does not smoke. He had some sinus surgery also in [facility] but he is not really sure why and he does not feel like it made much of a difference in his overall wellbeing.  PMH/Meds/All/SocHx/FamHx/ROS:   Past Medical History:  Diagnosis Date  . Diverticulitis  . H/O sinus surgery   Past Surgical History:  Procedure Laterality Date  . MOUTH SURGERY  . SINUS SURGERY   No family history of bleeding disorders, wound healing problems or difficulty with anesthesia.   Social History   Socioeconomic History  . Marital status: Married  Spouse name: Not on file  . Number of children: Not on file  . Years of education: Not on file  . Highest education level: Not on file  Occupational History  . Not on file  Social Needs  . Financial resource strain: Not on file  . Food insecurity  Worry: Not on file  Inability: Not on file  . Transportation needs  Medical: Not on file  Non-medical: Not on file  Tobacco Use  . Smoking status: Never Smoker  . Smokeless tobacco: Never Used  Substance and Sexual Activity  . Alcohol use: Not on file  . Drug use: Not on file  . Sexual activity: Not on file  Lifestyle  . Physical activity  Days per week: Not on file  Minutes per session: Not on file  . Stress: Not on file  Relationships  . Social Medical illustrator on phone: Not on file   Gets together: Not on file  Attends religious service: Not on file  Active member of club or organization: Not on file  Attends meetings of clubs or organizations: Not on file  Relationship status: Not on file  Other Topics Concern  . Not on file  Social History Narrative  . Not on file   Current Outpatient Medications:  . cyclobenzaprine (FLEXERIL) 10 MG tablet, Take 10 mg by mouth., Disp: , Rfl:  . naproxen (NAPROSYN) 500 MG tablet, Take 500 mg by mouth., Disp: , Rfl:  . propranoloL (INDERAL) 20 MG tablet, Take 20 mg by mouth., Disp: , Rfl:   A complete ROS was performed with pertinent positives/negatives noted in the HPI. The remainder of the ROS are negative.   Physical Exam:   Ht 1.829 m (6')  Wt (!) 149.7 kg (330 lb)  BMI 44.76 kg/m   General: Overweight gentleman, in no distress, breathing easily with a slight inspiratory noise. Voice is a little bit rough. Normal affect. In a pleasant mood. Head: Normocephalic, atraumatic. No masses, or scars. Eyes: Pupils are equal, and reactive to light. Vision is grossly intact. No spontaneous or gaze nystagmus. Ears: Ear canals are clear. Tympanic membranes are intact, with normal landmarks and the middle ears are clear and healthy. Hearing: Grossly normal. Nose: Nasal cavities are clear with healthy mucosa,  no polyps or exudate. Airways are patent. Face: No masses or scars, facial nerve function is symmetric. Oral Cavity: No mucosal abnormalities are noted. Tongue with normal mobility. Dentition appears healthy. Oropharynx: Tonsils are symmetric. There are no mucosal masses identified. Tongue base appears normal and healthy. Larynx/Hypopharynx: Inadequate due to excessive gag reflex Chest: Deferred Neck: No palpable masses, no cervical adenopathy, no thyroid nodules or enlargement. Neuro: Cranial nerves II-XII with normal function. Balance: Normal gate. Other findings: none.  Independent Review of Additional Tests or Records:   none  Procedures:  Procedure note: Flexible fiberoptic laryngoscopy  Details of the procedure were explained to the patient and all questions were answered.   Procedure:   After anesthetizing the nasal cavity with topical lidocaine and oxymetazoline, the flexible endoscope was introduced and passed through the nasal cavity into the nasopharynx. The scope was then advanced to the level of the oropharynx, then the hypopharynx and larynx. Scope number 12 was used. He has an exquisitely sensitive gag reflex and had difficulty even passing the scope through the nasal cavity. Ultimately I was able to get a good view of the larynx.  Findings:   The posterior soft palate, uvula, tongue base and vallecula were visualized and appeared healthy without mucosal masses or lesions. The epiglottis, aryepiglottic folds, hypopharynx, supraglottis, glottis were visualized and appeared healthy without mucosal masses or lesions except for a very tiny papillomatous growth along the left side of the anterior commissure. Vocal fold mobility was intact and symmetric. I was not able to visualize any pathology in the subglottic larynx although the view was somewhat limited.  Additional findings: None  The scope was withdrawn from the nose. He tolerated the procedure with some difficulty.   Impression & Plans:  History of chronic laryngeal papillomatosis. Minimal disease present today. No evidence of airway obstruction. Recommend every 40-month follow-up, with surgical intervention using micro-laser laryngoscopy as needed anytime he feels that his voice is limiting his life style and quality of life, and/or there is any breathing difficulty.

## 2019-01-02 ENCOUNTER — Other Ambulatory Visit: Payer: Self-pay

## 2019-01-02 ENCOUNTER — Encounter (HOSPITAL_COMMUNITY): Payer: Self-pay

## 2019-01-02 ENCOUNTER — Encounter (HOSPITAL_COMMUNITY)
Admission: RE | Admit: 2019-01-02 | Discharge: 2019-01-02 | Disposition: A | Discharge status: Home / Self Care | Payer: Medicaid Other | Source: Ambulatory Visit | Attending: Otolaryngology | Admitting: Otolaryngology

## 2019-01-02 ENCOUNTER — Other Ambulatory Visit (HOSPITAL_COMMUNITY)
Admission: RE | Admit: 2019-01-02 | Discharge: 2019-01-02 | Disposition: A | Discharge status: Home / Self Care | Payer: Medicaid Other | Source: Ambulatory Visit | Attending: Otolaryngology | Admitting: Otolaryngology

## 2019-01-02 DIAGNOSIS — Z20828 Contact with and (suspected) exposure to other viral communicable diseases: Secondary | ICD-10-CM | POA: Insufficient documentation

## 2019-01-02 DIAGNOSIS — Z01818 Encounter for other preprocedural examination: Secondary | ICD-10-CM | POA: Diagnosis present

## 2019-01-02 DIAGNOSIS — I10 Essential (primary) hypertension: Secondary | ICD-10-CM | POA: Insufficient documentation

## 2019-01-02 HISTORY — DX: Unspecified osteoarthritis, unspecified site: M19.90

## 2019-01-02 HISTORY — DX: Bipolar disorder, unspecified: F31.9

## 2019-01-02 HISTORY — DX: Morbid (severe) obesity due to excess calories: E66.01

## 2019-01-02 HISTORY — DX: Other complications of anesthesia, initial encounter: T88.59XA

## 2019-01-02 HISTORY — DX: Anxiety disorder, unspecified: F41.9

## 2019-01-02 HISTORY — DX: Diverticulitis of intestine, part unspecified, without perforation or abscess without bleeding: K57.92

## 2019-01-02 HISTORY — DX: Dyspnea, unspecified: R06.00

## 2019-01-02 HISTORY — DX: Essential (primary) hypertension: I10

## 2019-01-02 LAB — BASIC METABOLIC PANEL
Anion gap: 10 (ref 5–15)
BUN: 18 mg/dL (ref 6–20)
CO2: 25 mmol/L (ref 22–32)
Calcium: 8.9 mg/dL (ref 8.9–10.3)
Chloride: 105 mmol/L (ref 98–111)
Creatinine, Ser: 1.28 mg/dL — ABNORMAL HIGH (ref 0.61–1.24)
GFR calc Af Amer: >60 mL/min (ref >60)
GFR calc non Af Amer: >60 mL/min (ref >60)
Glucose, Bld: 128 mg/dL — ABNORMAL HIGH (ref 70–99)
Potassium: 4 mmol/L (ref 3.5–5.1)
Sodium: 140 mmol/L (ref 135–145)

## 2019-01-02 LAB — CBC
HCT: 46.1 % (ref 39.0–52.0)
Hemoglobin: 15.6 g/dL (ref 13.0–17.0)
MCH: 33.5 pg (ref 26.0–34.0)
MCHC: 33.8 g/dL (ref 30.0–36.0)
MCV: 98.9 fL (ref 80.0–100.0)
Platelets: 164 10*3/uL (ref 150–400)
RBC: 4.66 MIL/uL (ref 4.22–5.81)
RDW: 12.5 % (ref 11.5–15.5)
WBC: 5.9 10*3/uL (ref 4.0–10.5)
nRBC: 0 % (ref 0.0–0.2)

## 2019-01-02 LAB — SARS CORONAVIRUS 2 (TAT 6-24 HRS): SARS Coronavirus 2: NEGATIVE

## 2019-01-02 NOTE — Progress Notes (Addendum)
Anesthesia Chart Review:  Hx of chronic laryngeal papillomatosis. He has had multiple ENT procedures. Most recent procedures were in [facility], pt recently moved to [place]. Results of laryngoscopy performed in office by Dr. Constance Holster 06/12/18: "Procedure: After anesthetizing the nasal cavity with topical lidocaine and oxymetazoline, the flexible endoscope was introduced and passed through the nasal cavity into the nasopharynx. The scope was then advanced to the level of the oropharynx, then the hypopharynx and larynx. Scope number 12 was used. He has an exquisitely sensitive gag reflex and had difficulty even passing the scope through the nasal cavity. Ultimately I was able to get a good view of the larynx. Findings:  The posterior soft palate, uvula, tongue base and vallecula were visualized and appeared healthy without mucosal masses or lesions. The epiglottis, aryepiglottic folds, hypopharynx, supraglottis, glottis were visualized and appeared healthy without mucosal masses or lesions except for a very tiny papillomatous growth along the left side of the anterior commissure. Vocal fold mobility was intact and symmetric. I was not able to visualize any pathology in the subglottic larynx although the view was somewhat limited."  Pt reports he has been told by previous anesthesiologists that he is a "fast metabolizer" and he says he has previously "woken up" during procedures.  Preop labs reviewed, WNL.  EKG 01/02/19: NSR. Rate 80.

## 2019-01-02 NOTE — Progress Notes (Signed)
   01/02/19 1324  OBSTRUCTIVE SLEEP APNEA  Have you ever been diagnosed with sleep apnea through a sleep study? No  Do you snore loudly (loud enough to be heard through closed doors)?  1  Do you often feel tired, fatigued, or sleepy during the daytime (such as falling asleep during driving or talking to someone)? 1  Has anyone observed you stop breathing during your sleep? 1  Do you have, or are you being treated for high blood pressure? 1  BMI more than 35 kg/m2? 1  Age > 50 (1-yes) 1  Neck circumference greater than:Male 16 inches or larger, Male 17inches or larger? 1  Male Gender (Yes=1) 1  Obstructive Sleep Apnea Score 8

## 2019-01-02 NOTE — Anesthesia Preprocedure Evaluation (Addendum)
Anesthesia Evaluation  Patient identified by MRN, date of birth, ID band Patient awake    Reviewed: Allergy & Precautions, NPO status , Patient's Chart, lab work & pertinent test results  Airway Mallampati: I  TM Distance: >3 FB Neck ROM: Full    Dental  (+) Teeth Intact, Dental Advisory Given   Pulmonary neg pulmonary ROS,    breath sounds clear to auscultation       Cardiovascular hypertension,  Rhythm:Regular Rate:Normal     Neuro/Psych Anxiety Depression Bipolar Disorder negative neurological ROS     GI/Hepatic negative GI ROS, Neg liver ROS,   Endo/Other  negative endocrine ROS  Renal/GU negative Renal ROS     Musculoskeletal  (+) Arthritis ,   Abdominal (+) + obese,   Peds  Hematology negative hematology ROS (+)   Anesthesia Other Findings   Reproductive/Obstetrics negative OB ROS                           Anesthesia Physical Anesthesia Plan  ASA: II  Anesthesia Plan: General   Post-op Pain Management:    Induction: Intravenous  PONV Risk Score and Plan: 3 and Ondansetron, Dexamethasone and Midazolam  Airway Management Planned: Oral ETT  Additional Equipment: None  Intra-op Plan:   Post-operative Plan: Extubation in OR  Informed Consent: I have reviewed the patients History and Physical, chart, labs and discussed the procedure including the risks, benefits and alternatives for the proposed anesthesia with the patient or authorized representative who has indicated his/her understanding and acceptance.     Dental advisory given  Plan Discussed with: CRNA  Anesthesia Plan Comments: (Hx of chronic laryngeal papillomatosis. He has had multiple ENT procedures. Most recent procedures were in [facility], pt recently moved to [place]. Results of laryngoscopy performed in office by Dr. Constance Holster 06/12/18: "Procedure: After anesthetizing the nasal cavity with topical lidocaine and  oxymetazoline, the flexible endoscope was introduced and passed through the nasal cavity into the nasopharynx. The scope was then advanced to the level of the oropharynx, then the hypopharynx and larynx. Scope number 12 was used. He has an exquisitely sensitive gag reflex and had difficulty even passing the scope through the nasal cavity. Ultimately I was able to get a good view of the larynx. Findings:  The posterior soft palate, uvula, tongue base and vallecula were visualized and appeared healthy without mucosal masses or lesions. The epiglottis, aryepiglottic folds, hypopharynx, supraglottis, glottis were visualized and appeared healthy without mucosal masses or lesions except for a very tiny papillomatous growth along the left side of the anterior commissure. Vocal fold mobility was intact and symmetric. I was not able to visualize any pathology in the subglottic larynx although the view was somewhat limited."  Pt reports he has been told by previous anesthesiologists that he is a "fast metabolizer" and he says he has previously "woken up" during procedures.  Preop labs reviewed, WNL.  EKG 01/02/19: NSR. Rate 80.)     Anesthesia Quick Evaluation

## 2019-01-02 NOTE — Progress Notes (Signed)
PCP - Anastasia Pall, MD Cardiologist - denies  Chest x-ray - N/A EKG - 01/02/19 Stress Test - denies ECHO - denies Cardiac Cath - denies  Blood Thinner Instructions: N/A Aspirin Instructions: N/A; however, patient states Dr. Constance Holster told him he can continue to take Naproxyn prior to DOS without holding  COVID TEST- scheduled after PAT 01/02/19; aware of need for self-quarantine  Coronavirus Screening  Have you experienced the following symptoms:  Cough yes/no: No Fever (>100.27F)  yes/no: No Runny nose yes/no: No Sore throat yes/no: No Difficulty breathing/shortness of breath  yes/no: No  Have you or a family member traveled in the last 14 days and where? yes/no: No  If the patient indicates "YES" to the above questions, their PAT will be rescheduled to limit the exposure to others and, the surgeon will be notified. THE PATIENT WILL NEED TO BE ASYMPTOMATIC FOR 14 DAYS.   If the patient is not experiencing any of these symptoms, the PAT nurse will instruct them to NOT bring anyone with them to their appointment since they may have these symptoms or traveled as well.   Please remind your patients and families that hospital visitation restrictions are in effect and the importance of the restrictions.   Anesthesia review: Yes; patient states he has woken up during surgical procedures more than once and was told by anesthesiologist in the past that he "metabolizes quickly"; Jeneen Rinks, PA-C for anesthesia made aware of patient history - no records as patient just recently moved here from [facility] [place]  Patient denies shortness of breath, fever, cough and chest pain at PAT appointment  All instructions explained to the patient, with a verbal understanding of the material. Patient agrees to go over the instructions while at home for a better understanding. Patient also instructed to self quarantine after being tested for COVID-19. The opportunity to ask questions was provided.

## 2019-01-02 NOTE — Pre-Procedure Instructions (Signed)
[facility] [place], [place] - [address] [place] [address] Phone: [phone] Fax: [phone]  [facility] [id number] - [place], [place] - [address] [place] [address] Phone: [phone] Fax: [phone]      Your procedure is scheduled on Monday, November 30, from 07:30 AM to 08:23 AM.  Report to [facility] Entrance "A" at 05:30 A.M., and check in at the Admitting office.  Call this number if you have problems the morning of surgery:  [phone]  Call [phone] if you have any questions prior to your surgery date Monday-Friday 8am-4pm.    Remember:  Do not eat or drink after midnight the night before your surgery.     Take these medicines the morning of surgery with A SIP OF WATER :  propranolol (INDERAL) carisoprodol (SOMA)- if needed   As of today, STOP taking any Aspirin (unless otherwise instructed by your surgeon), Aleve, Naproxen, Ibuprofen, Motrin, Advil, Goody's, BC's, all herbal medications, fish oil, and all vitamins.    The Morning of Surgery  Do not wear jewelry.  Do not wear lotions, powders, colognes, or deodorant  Men may shave face and neck.  Do not bring valuables to the hospital.  [facility] is not responsible for any belongings or valuables.  If you are a smoker, DO NOT Smoke 24 hours prior to surgery  If you wear a CPAP at night please bring your mask, tubing, and machine the morning of surgery   Remember that you must have someone to transport you home after your surgery, and remain with you for 24 hours if you are discharged the same day.   Please bring cases for contacts, glasses, hearing aids, dentures or bridgework because it cannot be worn into surgery.    Leave your suitcase in the car.  After surgery it may be brought to your room.  For patients admitted  to the hospital, discharge time will be determined by your treatment team.  Patients discharged the day of surgery will not be allowed to drive home.    Special instructions:   [place]- Preparing For Surgery  Before surgery, you can play an important role. Because skin is not sterile, your skin needs to be as free of germs as possible. You can reduce the number of germs on your skin by washing with CHG (chlorahexidine gluconate) Soap before surgery.  CHG is an antiseptic cleaner which kills germs and bonds with the skin to continue killing germs even after washing.    Oral Hygiene is also important to reduce your risk of infection.  Remember - BRUSH YOUR TEETH THE MORNING OF SURGERY WITH YOUR REGULAR TOOTHPASTE  Please do not use if you have an allergy to CHG or antibacterial soaps. If your skin becomes reddened/irritated stop using the CHG.  Do not shave (including legs and underarms) for at least 48 hours prior to first CHG shower. It is OK to shave your face.  Please follow these instructions carefully.   1. Shower the NIGHT BEFORE SURGERY and the MORNING OF SURGERY with CHG Soap.   2. If you chose to wash your hair, wash your hair first as usual with your normal shampoo.  3. After you shampoo, rinse your hair and body thoroughly to remove the shampoo.  4. Use CHG as you would any other liquid soap. You  can apply CHG directly to the skin and wash gently with a scrungie or a clean washcloth.   5. Apply the CHG Soap to your body ONLY FROM THE NECK DOWN.  Do not use on open wounds or open sores. Avoid contact with your eyes, ears, mouth and genitals (private parts). Wash Face and genitals (private parts)  with your normal soap.   6. Wash thoroughly, paying special attention to the area where your surgery will be performed.  7. Thoroughly rinse your body with warm water from the neck down.  8. DO NOT shower/wash with your normal soap after using and rinsing off the CHG  Soap.  9. Pat yourself dry with a CLEAN TOWEL.  10. Wear CLEAN PAJAMAS to bed the night before surgery, wear comfortable clothes the morning of surgery  11. Place CLEAN SHEETS on your bed the night of your first shower and DO NOT SLEEP WITH PETS.    Day of Surgery:   Remember to brush your teeth WITH YOUR REGULAR TOOTHPASTE. Please shower the morning of surgery with the CHG soap Do not apply any deodorants/lotions. Please wear clean clothes to the hospital/surgery center.      Please read over the following fact sheets that you were given.

## 2019-01-05 ENCOUNTER — Ambulatory Visit (HOSPITAL_COMMUNITY): Payer: Medicaid Other | Admitting: Registered Nurse

## 2019-01-05 ENCOUNTER — Encounter (HOSPITAL_COMMUNITY)
Admission: RE | Disposition: A | Discharge status: Home / Self Care | Payer: Self-pay | Source: Home / Self Care | Attending: Otolaryngology

## 2019-01-05 ENCOUNTER — Other Ambulatory Visit: Payer: Self-pay

## 2019-01-05 ENCOUNTER — Ambulatory Visit (HOSPITAL_COMMUNITY): Payer: Medicaid Other | Admitting: Physician Assistant

## 2019-01-05 ENCOUNTER — Ambulatory Visit (HOSPITAL_COMMUNITY)
Admission: RE | Admit: 2019-01-05 | Discharge: 2019-01-05 | Disposition: A | Discharge status: Home / Self Care | Payer: Medicaid Other | Attending: Otolaryngology | Admitting: Otolaryngology

## 2019-01-05 ENCOUNTER — Encounter (HOSPITAL_COMMUNITY): Payer: Self-pay | Admitting: General Practice

## 2019-01-05 DIAGNOSIS — Z79899 Other long term (current) drug therapy: Secondary | ICD-10-CM | POA: Diagnosis not present

## 2019-01-05 DIAGNOSIS — I10 Essential (primary) hypertension: Secondary | ICD-10-CM | POA: Insufficient documentation

## 2019-01-05 DIAGNOSIS — M1990 Unspecified osteoarthritis, unspecified site: Secondary | ICD-10-CM | POA: Insufficient documentation

## 2019-01-05 DIAGNOSIS — J383 Other diseases of vocal cords: Secondary | ICD-10-CM | POA: Insufficient documentation

## 2019-01-05 DIAGNOSIS — F319 Bipolar disorder, unspecified: Secondary | ICD-10-CM | POA: Diagnosis not present

## 2019-01-05 HISTORY — PX: MICROLARYNGOSCOPY WITH CO2 LASER AND EXCISION OF VOCAL CORD LESION: SHX5970

## 2019-01-05 SURGERY — MICROLARYNGOSCOPY WITH CO2 LASER AND EXCISION OF VOCAL CORD LESION
Anesthesia: General | Site: Mouth | Laterality: Bilateral

## 2019-01-05 MED ORDER — ACETAMINOPHEN 325 MG PO TABS: 325.0000 mg | ORAL_TABLET | Freq: Once | ORAL | Status: DC | PRN

## 2019-01-05 MED ORDER — PROPOFOL 10 MG/ML IV BOLUS
INTRAVENOUS | Status: DC | PRN
  Administered 2019-01-05: 50 mg via INTRAVENOUS
  Administered 2019-01-05: 200 mg via INTRAVENOUS

## 2019-01-05 MED ORDER — LIDOCAINE 2% (20 MG/ML) 5 ML SYRINGE
INTRAMUSCULAR | Status: AC
  Filled 2019-01-05: qty 5

## 2019-01-05 MED ORDER — FENTANYL CITRATE (PF) 100 MCG/2ML IJ SOLN: 25.0000 ug | INTRAMUSCULAR | Status: DC | PRN

## 2019-01-05 MED ORDER — OXYMETAZOLINE HCL 0.05 % NA SOLN
NASAL | Status: DC | PRN
  Administered 2019-01-05: 1

## 2019-01-05 MED ORDER — EPINEPHRINE HCL (NASAL) 0.1 % NA SOLN
NASAL | Status: AC
  Filled 2019-01-05: qty 30

## 2019-01-05 MED ORDER — FENTANYL CITRATE (PF) 100 MCG/2ML IJ SOLN
INTRAMUSCULAR | Status: DC | PRN
  Administered 2019-01-05: 100 ug via INTRAVENOUS

## 2019-01-05 MED ORDER — FENTANYL CITRATE (PF) 250 MCG/5ML IJ SOLN
INTRAMUSCULAR | Status: AC
  Filled 2019-01-05: qty 5

## 2019-01-05 MED ORDER — MIDAZOLAM HCL 5 MG/5ML IJ SOLN
INTRAMUSCULAR | Status: DC | PRN
  Administered 2019-01-05: 2 mg via INTRAVENOUS

## 2019-01-05 MED ORDER — LIDOCAINE-EPINEPHRINE 1 %-1:100000 IJ SOLN
INTRAMUSCULAR | Status: AC
  Filled 2019-01-05: qty 1

## 2019-01-05 MED ORDER — PHENYLEPHRINE 40 MCG/ML (10ML) SYRINGE FOR IV PUSH (FOR BLOOD PRESSURE SUPPORT)
PREFILLED_SYRINGE | INTRAVENOUS | Status: DC | PRN
  Administered 2019-01-05: 40 ug via INTRAVENOUS
  Administered 2019-01-05: 80 ug via INTRAVENOUS

## 2019-01-05 MED ORDER — EPHEDRINE 5 MG/ML INJ
INTRAVENOUS | Status: AC
  Filled 2019-01-05: qty 10

## 2019-01-05 MED ORDER — LACTATED RINGERS IV SOLN
INTRAVENOUS | Status: DC | PRN
  Administered 2019-01-05: 07:00:00 via INTRAVENOUS

## 2019-01-05 MED ORDER — OXYMETAZOLINE HCL 0.05 % NA SOLN
NASAL | Status: AC
  Filled 2019-01-05: qty 30

## 2019-01-05 MED ORDER — ONDANSETRON HCL 4 MG/2ML IJ SOLN
INTRAMUSCULAR | Status: DC | PRN
  Administered 2019-01-05: 4 mg via INTRAVENOUS

## 2019-01-05 MED ORDER — ACETAMINOPHEN 10 MG/ML IV SOLN: 1000.0000 mg | Freq: Once | INTRAVENOUS | Status: DC | PRN

## 2019-01-05 MED ORDER — EPHEDRINE SULFATE-NACL 50-0.9 MG/10ML-% IV SOSY
PREFILLED_SYRINGE | INTRAVENOUS | Status: DC | PRN
  Administered 2019-01-05 (×5): 10 mg via INTRAVENOUS

## 2019-01-05 MED ORDER — PROPOFOL 10 MG/ML IV BOLUS
INTRAVENOUS | Status: AC
  Filled 2019-01-05: qty 40

## 2019-01-05 MED ORDER — PROMETHAZINE HCL 25 MG/ML IJ SOLN: 6.2500 mg | INTRAMUSCULAR | Status: DC | PRN

## 2019-01-05 MED ORDER — SUCCINYLCHOLINE CHLORIDE 20 MG/ML IJ SOLN
INTRAMUSCULAR | Status: DC | PRN
  Administered 2019-01-05: 140 mg via INTRAVENOUS

## 2019-01-05 MED ORDER — DEXAMETHASONE SODIUM PHOSPHATE 10 MG/ML IJ SOLN
INTRAMUSCULAR | Status: AC
  Filled 2019-01-05: qty 1

## 2019-01-05 MED ORDER — DEXAMETHASONE SODIUM PHOSPHATE 10 MG/ML IJ SOLN
INTRAMUSCULAR | Status: DC | PRN
  Administered 2019-01-05: 10 mg via INTRAVENOUS

## 2019-01-05 MED ORDER — ONDANSETRON HCL 4 MG/2ML IJ SOLN
INTRAMUSCULAR | Status: AC
  Filled 2019-01-05: qty 2

## 2019-01-05 MED ORDER — PHENYLEPHRINE 40 MCG/ML (10ML) SYRINGE FOR IV PUSH (FOR BLOOD PRESSURE SUPPORT)
PREFILLED_SYRINGE | INTRAVENOUS | Status: AC
  Filled 2019-01-05: qty 10

## 2019-01-05 MED ORDER — ACETAMINOPHEN 160 MG/5ML PO SOLN: 325.0000 mg | Freq: Once | ORAL | Status: DC | PRN

## 2019-01-05 MED ORDER — MEPERIDINE HCL 25 MG/ML IJ SOLN: 6.2500 mg | INTRAMUSCULAR | Status: DC | PRN

## 2019-01-05 MED ORDER — LIDOCAINE 2% (20 MG/ML) 5 ML SYRINGE
INTRAMUSCULAR | Status: DC | PRN
  Administered 2019-01-05: 60 mg via INTRAVENOUS
  Administered 2019-01-05: 40 mg via INTRAVENOUS

## 2019-01-05 MED ORDER — MIDAZOLAM HCL 2 MG/2ML IJ SOLN
INTRAMUSCULAR | Status: AC
  Filled 2019-01-05: qty 2

## 2019-01-05 MED ORDER — SUCCINYLCHOLINE CHLORIDE 200 MG/10ML IV SOSY
PREFILLED_SYRINGE | INTRAVENOUS | Status: AC
  Filled 2019-01-05: qty 10

## 2019-01-05 MED ORDER — LACTATED RINGERS IV SOLN: INTRAVENOUS | Status: DC

## 2019-01-05 SURGICAL SUPPLY — 26 items
BLADE SURG 15 STRL LF DISP TIS (BLADE) IMPLANT
BLADE SURG 15 STRL SS (BLADE)
BNDG EYE OVAL (GAUZE/BANDAGES/DRESSINGS) ×4 IMPLANT
CANISTER SUCT 3000ML PPV (MISCELLANEOUS) ×2 IMPLANT
COVER WAND RF STERILE (DRAPES) IMPLANT
DRAPE HALF SHEET 40X57 (DRAPES) ×2 IMPLANT
GAUZE SPONGE 4X4 12PLY STRL (GAUZE/BANDAGES/DRESSINGS) IMPLANT
GLOVE ECLIPSE 7.5 STRL STRAW (GLOVE) ×2 IMPLANT
GOWN STRL REUS W/ TWL XL LVL3 (GOWN DISPOSABLE) IMPLANT
GOWN STRL REUS W/TWL 2XL LVL3 (GOWN DISPOSABLE) IMPLANT
GOWN STRL REUS W/TWL XL LVL3 (GOWN DISPOSABLE)
GUARD TEETH (MISCELLANEOUS) IMPLANT
KIT BASIN OR (CUSTOM PROCEDURE TRAY) ×2 IMPLANT
MARKER SKIN DUAL TIP RULER LAB (MISCELLANEOUS) IMPLANT
NEEDLE 22X1 1/2 (OR ONLY) (NEEDLE) IMPLANT
NEEDLE HYPO 18GX1.5 BLUNT FILL (NEEDLE) ×2 IMPLANT
NEEDLE PRECISIONGLIDE 27X1.5 (NEEDLE) IMPLANT
NS IRRIG 1000ML POUR BTL (IV SOLUTION) ×2 IMPLANT
PATTIES SURGICAL .5 X3 (DISPOSABLE) ×2 IMPLANT
SOLUTION BUTLER CLEAR DIP (MISCELLANEOUS) ×2 IMPLANT
SURGILUBE 2OZ TUBE FLIPTOP (MISCELLANEOUS) IMPLANT
SYR 5ML LL (SYRINGE) ×2 IMPLANT
SYR CONTROL 10ML LL (SYRINGE) IMPLANT
TOWEL GREEN STERILE FF (TOWEL DISPOSABLE) ×2 IMPLANT
TUBE CONNECTING 20X1/4 (TUBING) ×2 IMPLANT
WATER STERILE IRR 1000ML POUR (IV SOLUTION) ×2 IMPLANT

## 2019-01-05 NOTE — Discharge Instructions (Signed)
Avoid screaming, singing, whispering, straining of the voice for the next 2 weeks.

## 2019-01-05 NOTE — Anesthesia Postprocedure Evaluation (Signed)
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)     Patient location during evaluation: PACU Anesthesia Type: General Level of consciousness: awake and alert Pain management: pain level controlled Vital Signs Assessment: post-procedure vital signs reviewed and stable Respiratory status: spontaneous breathing, nonlabored ventilation, respiratory function stable and patient connected to nasal cannula oxygen Cardiovascular status: blood pressure returned to baseline and stable Postop Assessment: no apparent nausea or vomiting Anesthetic complications: no    Last Vitals:  Vitals:   01/05/19 0940 01/05/19 0955  BP: 113/84 104/62  Pulse: 62 67  Resp: 15 17  Temp:  36.7 C  SpO2: 97% 93%    Last Pain:  Vitals:   01/05/19 0955  TempSrc:   PainSc: 0-No pain                 Effie Berkshire

## 2019-01-05 NOTE — Interval H&P Note (Signed)
History and Physical Interval Note:  01/05/2019 7:24 AM  Nathaniel Grant  has presented today for surgery, with the diagnosis of vocal cord lesion.  The various methods of treatment have been discussed with the patient and family. After consideration of risks, benefits and other options for treatment, the patient has consented to  Procedure(s): MICROLARYNGOSCOPY WITH CO2 LASER AND EXCISION OF VOCAL CORD LESION (Bilateral) as a surgical intervention.  The patient's history has been reviewed, patient examined, no change in status, stable for surgery.  I have reviewed the patient's chart and labs.  Questions were answered to the patient's satisfaction.     Nathaniel Grant

## 2019-01-05 NOTE — Transfer of Care (Signed)
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  Last Pain:  Vitals:   01/05/19 0630  TempSrc:   PainSc: 7       Patients Stated Pain Goal: 3 (12/87/86 7672)  Complications: No apparent anesthesia complications

## 2019-01-05 NOTE — Op Note (Signed)
OPERATIVE REPORT  DATE OF SURGERY: 01/05/2019  PATIENT:  Nathaniel Grant,  58 y.o. male  PRE-OPERATIVE DIAGNOSIS:  vocal cord lesion  POST-OPERATIVE DIAGNOSIS:  vocal cord lesion  PROCEDURE:  Procedure(s): MICROLARYNGOSCOPY WITH CO2 LASER AND EXCISION OF VOCAL CORD LESION  SURGEON:  Beckie Salts, MD  ASSISTANTS: None  ANESTHESIA:   General   EBL: 0 ml  DRAINS: None  LOCAL MEDICATIONS USED:  None  SPECIMEN:  none  COUNTS:  Correct  PROCEDURE DETAILS: The patient was taken to the operating room and placed on the operating table in the supine position. Following induction of general endotracheal anesthesia, using a laser safe endotracheal tube, the table was turned 90 degrees.  Patient was draped in a standard fashion for laser surgery including saline soaked eye pads and saline soaked towels around the face.  Maxillary tooth protector was used throughout the case.  She was a Jako laryngoscope was used to enter the oral cavity used to visualize the larynx.  The larynx and hypopharynx were thoroughly inspected.  He was impossible to examine while awake in the office so a thorough exam was accomplished today.  Hypopharynx was clear and free of any abnormal lesions.  The larynx was inspected as well.  The area of the left anterior commissure was inspected and there was no pathology identified there today.  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  The only abnormal findings were some fullness of the false cord on the right anteriorly by the anterior commissure and more posteriorly as well.  The cords themselves look clean.  The ventricles are clean.  Anterior commissure was clean.  There is no webbing.  The subglottis was completely clear as well.  The CO2 laser was used using the operating microscope and setting of 3 W continuous power was used to ablate the 2 areas of fullness of the false cord on the right.  No other lesions were identified.   Posterior commissure was clear as well.  Esophageal introitus was clear.  Scope was removed.  Patient was awakened extubated and transferred to recovery in stable condition.    PATIENT DISPOSITION:  To PACU, stable

## 2019-01-05 NOTE — Anesthesia Procedure Notes (Signed)
Procedure Name: Intubation Date/Time: 01/05/2019 7:50 AM Performed by: Trinna Post., CRNA Pre-anesthesia Checklist: Patient identified, Emergency Drugs available, Suction available, Patient being monitored and Timeout performed Patient Re-evaluated:Patient Re-evaluated prior to induction Oxygen Delivery Method: Circle system utilized Preoxygenation: Pre-oxygenation with 100% oxygen Induction Type: IV induction and Rapid sequence Laryngoscope Size: Glidescope and 4 Grade View: Grade I Laser Tube: Laser Tube and Cuffed inflated with minimal occlusive pressure - saline Tube size: 6.0 mm Number of attempts: 1 Airway Equipment and Method: Video-laryngoscopy and Stylet Placement Confirmation: ETT inserted through vocal cords under direct vision,  positive ETCO2 and breath sounds checked- equal and bilateral Tube secured with: Tape Dental Injury: Teeth and Oropharynx as per pre-operative assessment

## 2019-01-06 ENCOUNTER — Encounter (HOSPITAL_COMMUNITY): Payer: Self-pay | Admitting: Otolaryngology

## 2019-01-16 NOTE — Progress Notes (Signed)
Email sent to patient (preferred way of communicating) reference date change of PREP to Jan 4th, 2021.  Called patient to ensure message received. S/W reference same.  Advised would call him closer to 1/4 to confirm.

## 2019-02-02 ENCOUNTER — Telehealth: Payer: Self-pay | Admitting: Orthopaedic Surgery

## 2019-02-02 NOTE — Telephone Encounter (Signed)
06/11/2018 ov note faxed to Ovidio Hanger, [place]

## 2019-02-03 ENCOUNTER — Other Ambulatory Visit: Payer: Self-pay | Admitting: Physical Medicine and Rehabilitation

## 2019-02-03 DIAGNOSIS — R200 Anesthesia of skin: Secondary | ICD-10-CM

## 2019-02-03 DIAGNOSIS — M542 Cervicalgia: Secondary | ICD-10-CM

## 2019-02-05 ENCOUNTER — Telehealth: Payer: Self-pay

## 2019-02-05 NOTE — Telephone Encounter (Signed)
Left VMT patient with updated change of start date of PREP to 02/23/2019.  Requested call back with receipt of message.

## 2019-02-06 ENCOUNTER — Encounter: Payer: Self-pay | Admitting: Orthopaedic Surgery

## 2019-02-15 ENCOUNTER — Other Ambulatory Visit: Payer: Medicaid Other

## 2019-02-22 ENCOUNTER — Ambulatory Visit
Admission: RE | Admit: 2019-02-22 | Discharge: 2019-02-22 | Disposition: A | Discharge status: Home / Self Care | Payer: Medicaid Other | Source: Ambulatory Visit | Attending: Physical Medicine and Rehabilitation | Admitting: Physical Medicine and Rehabilitation

## 2019-02-22 ENCOUNTER — Other Ambulatory Visit: Payer: Self-pay

## 2019-02-22 DIAGNOSIS — M542 Cervicalgia: Secondary | ICD-10-CM

## 2019-02-22 DIAGNOSIS — R200 Anesthesia of skin: Secondary | ICD-10-CM

## 2019-03-03 ENCOUNTER — Other Ambulatory Visit: Payer: Self-pay | Admitting: Physical Medicine and Rehabilitation

## 2019-03-03 DIAGNOSIS — M47812 Spondylosis without myelopathy or radiculopathy, cervical region: Secondary | ICD-10-CM

## 2019-03-10 ENCOUNTER — Telehealth: Payer: Self-pay

## 2019-03-10 NOTE — Telephone Encounter (Signed)
Left message with update in next start date of PREP 03/31/2019 will meet every tues/thur at 1p-215pm x 12 wks.  Request call back to confirm and for any questions. Offered help with finding gym shoes via SW.

## 2019-03-16 ENCOUNTER — Other Ambulatory Visit: Payer: Medicaid Other

## 2019-03-20 ENCOUNTER — Telehealth: Payer: Self-pay | Admitting: Licensed Clinical Social Worker

## 2019-03-20 ENCOUNTER — Telehealth: Payer: Self-pay

## 2019-03-20 NOTE — Telephone Encounter (Signed)
Patient is agreeable to PREP starting 04/14/2019. Needs gym shoes. Contacted SW who will order size 13 wide velcro shoes from [employer] and deliver to patients home. Asked patient to call me if they do not arrive.

## 2019-03-20 NOTE — Telephone Encounter (Signed)
CSW received request from PREP program to assist patient with sneakers as he is about to start program and has only flip flops. CSW ordered patient per request size 13 wide sneakers via [employer] and will be delivered to his home early next week.CSW notified PREP program of delivery. Lasandra Beech, LCSW, CCSW-MCS [phone]

## 2019-03-23 ENCOUNTER — Ambulatory Visit
Admission: RE | Admit: 2019-03-23 | Discharge: 2019-03-23 | Disposition: A | Discharge status: Home / Self Care | Payer: Medicaid Other | Source: Ambulatory Visit | Attending: Physical Medicine and Rehabilitation | Admitting: Physical Medicine and Rehabilitation

## 2019-03-23 ENCOUNTER — Other Ambulatory Visit: Payer: Self-pay

## 2019-03-23 DIAGNOSIS — M47812 Spondylosis without myelopathy or radiculopathy, cervical region: Secondary | ICD-10-CM

## 2019-03-23 MED ORDER — IOPAMIDOL (ISOVUE-M 300) INJECTION 61%: 1.0000 mL | Freq: Once | INTRAMUSCULAR | Status: DC | PRN

## 2019-03-23 MED ORDER — TRIAMCINOLONE ACETONIDE 40 MG/ML IJ SUSP (RADIOLOGY): 60.0000 mg | Freq: Once | INTRAMUSCULAR | Status: DC

## 2019-03-31 NOTE — Progress Notes (Signed)
[facility] PREP Weekly Session   Patient Details  Name: Nathaniel Grant MRN: [id number] Date of Birth: 1960/03/04 Age: 59 y.o. PCP: Eartha Inch, MD  There were no vitals filed for this visit.  [facility] Weekly seesion - 03/31/19 1500      Weekly Session   Topic Discussed  Goal setting and welcome to the program   Reviewed Scale of perceived exertion   Comments  12 wk schedule for strength training given      Toured facility Reviewed  Goals Plans for this week  And 12 wks discussed  Very interested in pool exercises, reminded to check online for sign ups   [name] 03/31/2019, 3:16 PM

## 2019-04-07 NOTE — Progress Notes (Signed)
[facility] PREP Weekly Session   Patient Details  Name: Nathaniel Grant MRN: [id number] Date of Birth: [birth year] Age: 59 y.o. PCP: Eartha Inch, MD  Vitals:   04/07/19 1657  Weight: (!) 393 lb 12.8 oz (178.6 kg)    [facility] Weekly seesion - 04/07/19 1600      Weekly Session   Topic Discussed  Other ways to be active;Importance of resistance training    Minutes exercised this week  0 minutes   did note swimming laps and stretching since last class   Classes attended to date  3      Fun things you did since last meeting: swim Grateful for the sun Barriers/struggles: private   Did well on rowing machine and bike today.   [name] 04/07/2019, 4:59 PM

## 2019-04-14 NOTE — Progress Notes (Signed)
[facility] PREP Weekly Session   Patient Details  Name: Nathaniel Grant MRN: [id number] Date of Birth: [birth year] Age: 59 y.o. PCP: Eartha Inch, MD  There were no vitals filed for this visit.  No Updated WT recorded   [facility] Weekly seesion - 04/14/19 1800      Weekly Session   Topic Discussed  Healthy eating tips   hidden sugar brochure given   Minutes exercised this week  60 minutes   45 cardio/strength ( including swimming) 15 flexibility     Fun things since last meeting: swim Grateful for: Trump isn't POTUS Nutrition Celebration: Did not buy or drink a container of southern tea. Prefers sugarless tea   Nathaniel Grant 04/14/2019, 6:04 PM

## 2019-04-21 NOTE — Progress Notes (Signed)
[facility] PREP Weekly Session   Patient Details  Name: Nathaniel Grant MRN: [id number] Date of Birth: 1960-09-25 Age: 59 y.o. PCP: Eartha Inch, MD  Vitals:   04/21/19 1802  Weight: (!) 394 lb (178.7 kg)    [facility] Weekly seesion - 04/21/19 1800      Weekly Session   Topic Discussed  Health habits    Minutes exercised this week  70 minutes   45 cardio, 25 flexibility   Classes attended to date  7      Fun things since last meeting: time with my cats Grateful for: my cats Nutrition celebration: nothing special Barriers/struggles: private  [name] 04/21/2019, 6:05 PM

## 2019-04-28 NOTE — Progress Notes (Signed)
[facility] PREP Weekly Session   Patient Details  Name: Nathaniel Grant MRN: [id number] Date of Birth: Feb 21, 1960 Age: 59 y.o. PCP: Eartha Inch, MD  Vitals:   04/28/19 1634  Weight: (!) 393 lb (178.3 kg)    [facility] Weekly seesion - 04/28/19 1600      Weekly Session   Topic Discussed  Restaurant Eating   salt demo   Minutes exercised this week  50 minutes    Classes attended to date  60       Fun things: hung out with cats No nutrition celebrations  [name] 04/28/2019, 4:36 PM

## 2019-05-06 NOTE — Progress Notes (Signed)
[facility] PREP Weekly Session   Patient Details  Name: FELDER LEBEDA MRN: [id number] Date of Birth: May 13, 1960 Age: 59 y.o. PCP: Eartha Inch, MD  There were no vitals filed for this visit.  [facility] Weekly seesion - 05/06/19 1000      Weekly Session   Topic Discussed  Stress management and problem solving   Chair yoga, breathwork   Minutes exercised this week  40 minutes    Classes attended to date  11     Wt: 393 lbs Fun things since last meeting: played with cats Grateful for: cats Nutrition celebration: none to speak of    [name] 05/06/2019, 10:57 AM

## 2019-05-12 NOTE — Progress Notes (Signed)
[facility] PREP Weekly Session   Patient Details  Name: MAXIM BEDEL MRN: [id number] Date of Birth: [birth year] Age: 59 y.o. PCP: Eartha Inch, MD  Vitals:   05/12/19 1601  Weight: (!) 389 lb (176.4 kg)    [facility] Weekly seesion - 05/12/19 1600      Weekly Session   Topic Discussed  Expectations and non-scale victories   sugar demo   Minutes exercised this week  210 minutes    Classes attended to date  108      Fun things since last meeting: played with my cats Grateful for: my cats Nutrition celebration: none to speak of  [name] 05/12/2019, 4:02 PM

## 2019-05-19 NOTE — Progress Notes (Signed)
[facility] PREP Weekly Session   Patient Details  Name: Nathaniel Grant MRN: [id number] Date of Birth: [birth year] Age: 59 y.o. PCP: Eartha Inch, MD  Vitals:   05/19/19 1626  Weight: (!) 389 lb (176.4 kg)    [facility] Weekly seesion - 05/19/19 1600      Weekly Session   Topic Discussed  --   portion control   Minutes exercised this week  30 minutes    Classes attended to date  78      Fun things: played with cats and new cat toys Grateful for: new cat toys for my cats   [name] 05/19/2019, 4:28 PM

## 2019-05-26 NOTE — Progress Notes (Signed)
[facility] PREP Weekly Session   Patient Details  Name: Nathaniel Grant MRN: [id number] Date of Birth: 23-May-1960 Age: 59 y.o. PCP: Eartha Inch, MD  Vitals:   05/26/19 1605  Weight: (!) 388 lb (176 kg)    [facility] Weekly seesion - 05/26/19 1600      Weekly Session   Topic Discussed  Finding support    Minutes exercised this week  60 minutes    Classes attended to date  43      Fun things since last meeting: Chiropractor Grateful for: Pam and the [facility]  Nutrition celebration: none to speak of   [name] 05/26/2019, 4:06 PM

## 2019-06-02 NOTE — Progress Notes (Signed)
[facility] PREP Weekly Session   Patient Details  Name: FINLEE MILO MRN: [id number] Date of Birth: 04-10-1960 Age: 58 y.o. PCP: Eartha Inch, MD  Vitals:   06/02/19 1550  Weight: (!) 387 lb (175.5 kg)    [facility] Weekly seesion - 06/02/19 1500      Weekly Session   Topic Discussed  Calorie breakdown   clean 15 and dirty dozen handout   Minutes exercised this week  65 minutes    Classes attended to date  37      Fun things since last meeting: cat birthday party Grateful for: cat birthdays  C/o pain in legs and groin. Legs less red however very swollen. Has MD appt next on 5/11 for eval.   [name] 06/02/2019, 3:51 PM

## 2020-12-12 IMAGING — MR MR CERVICAL SPINE W/O CM
4 of 5 series · 25 of 48 positions shown · non-contrast
Comparison: Radiography 06/11/2018

CLINICAL DATA: Chronic neck and shoulder pain in the interscapular
region. Numbness and weakness of the hands.

EXAM:
MRI CERVICAL SPINE WITHOUT CONTRAST
TECHNIQUE: Multiplanar, multisequence MR imaging of the cervical spine was
performed. No intravenous contrast was administered.

[Series 3: T2 post-contrast · sagittal · 3.3mm · 0.37mm/px · 6 of 13 slices shown]
[im 1/13]
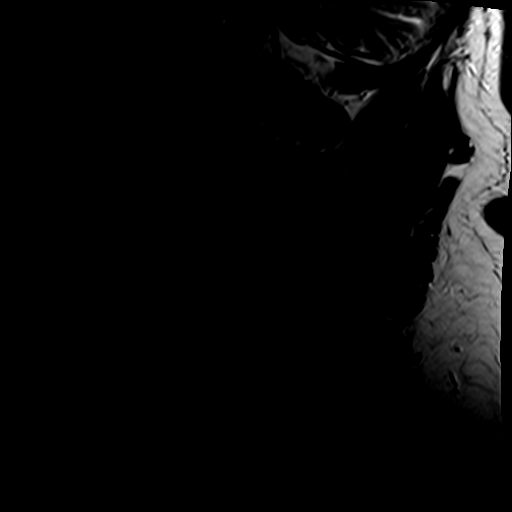
[im 3/13]
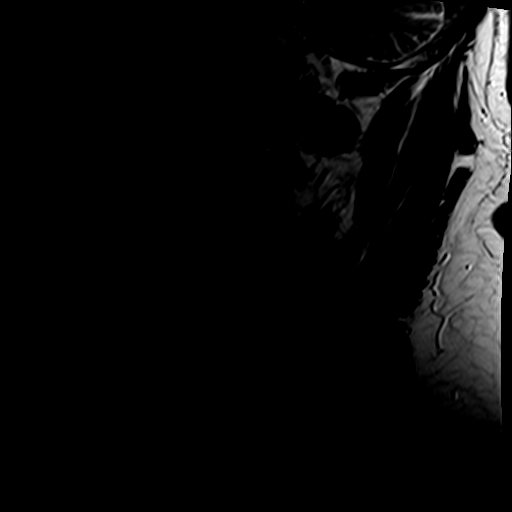
[im 5/13]
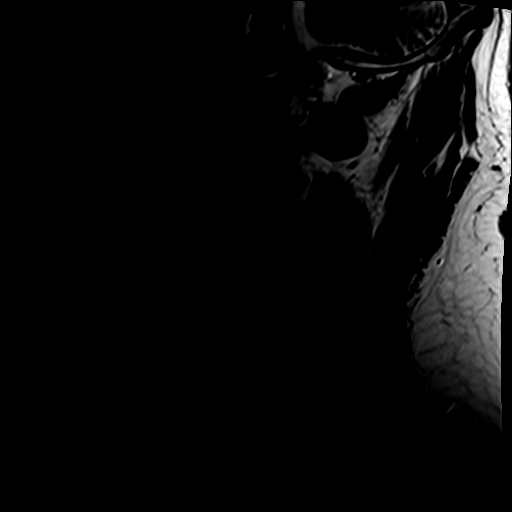
[im 8/13]
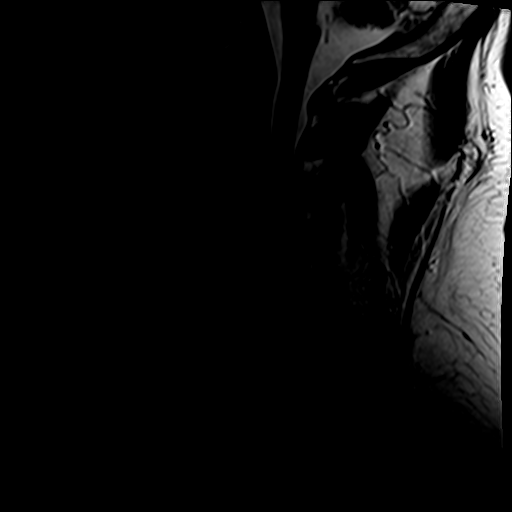
[im 10/13]
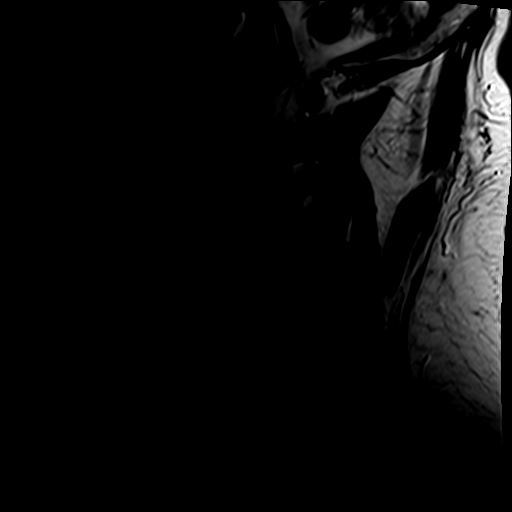
[im 13/13]
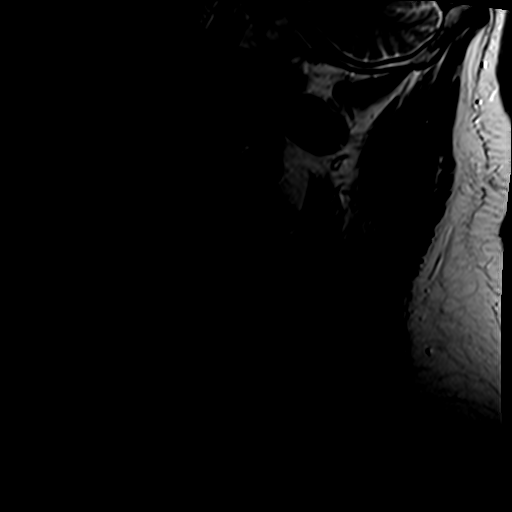

[Series 4: T1 · sagittal · 3.3mm · 0.37mm/px · 6 of 13 slices shown]
[im 1/13]
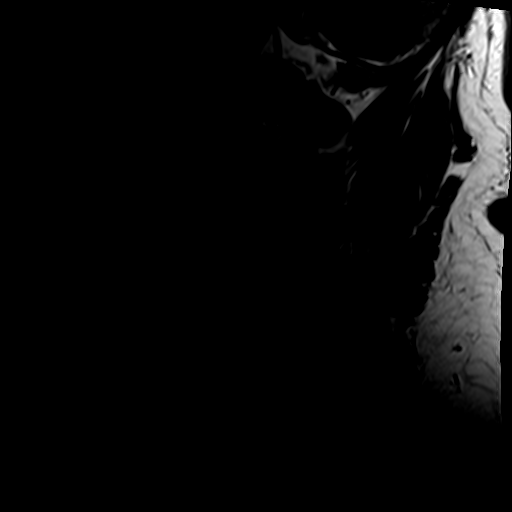
[im 3/13]
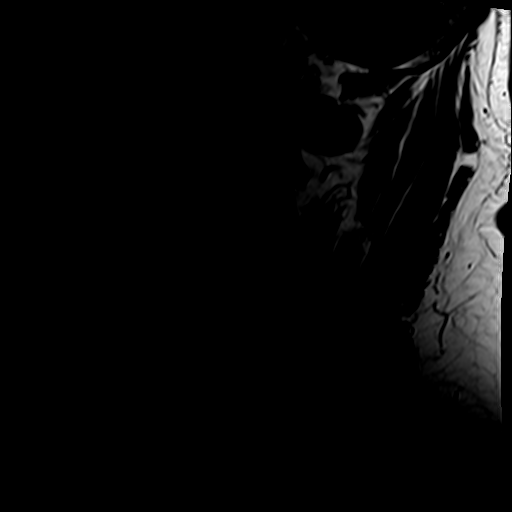
[im 5/13]
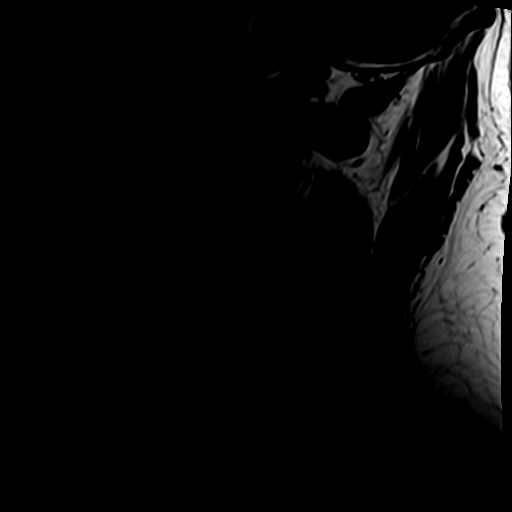
[im 8/13]
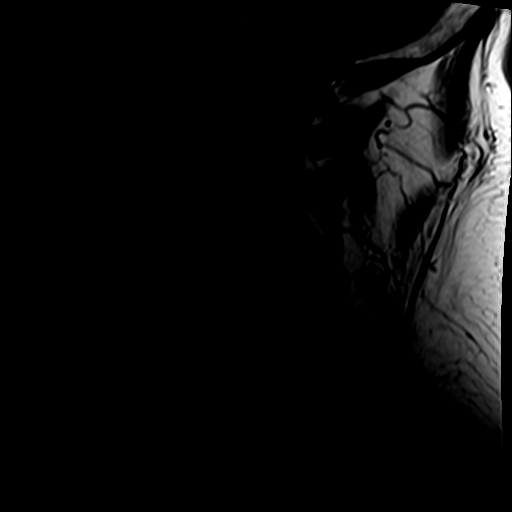
[im 10/13]
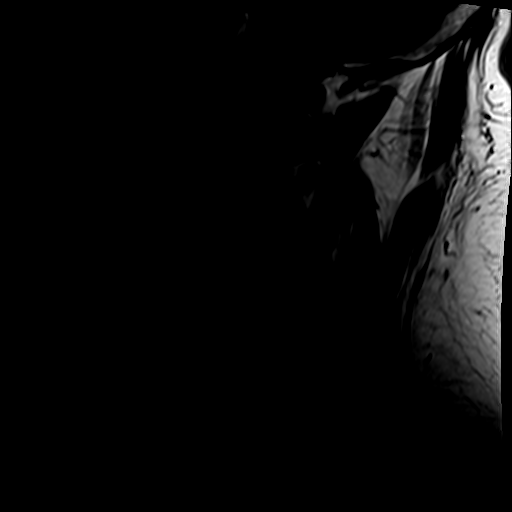
[im 13/13]
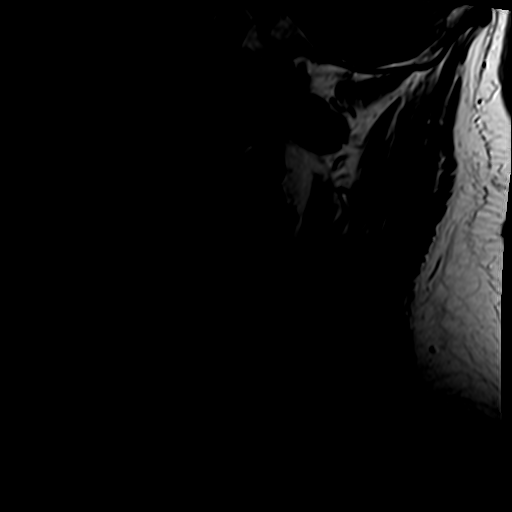

[Series 5: tir sag · sagittal · 3.3mm · 0.37mm/px · 4 of 13 slices shown]
[im 1/13]
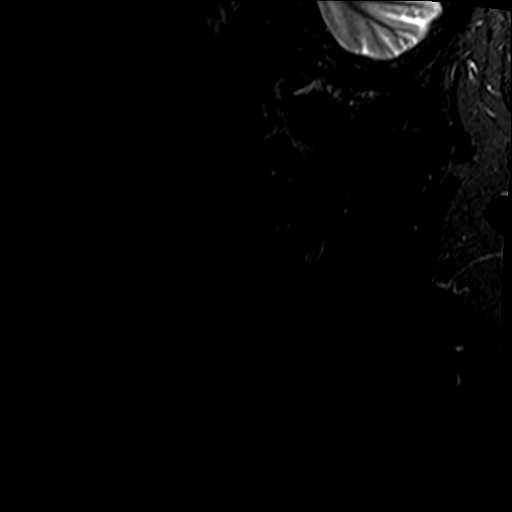
[im 3/13]
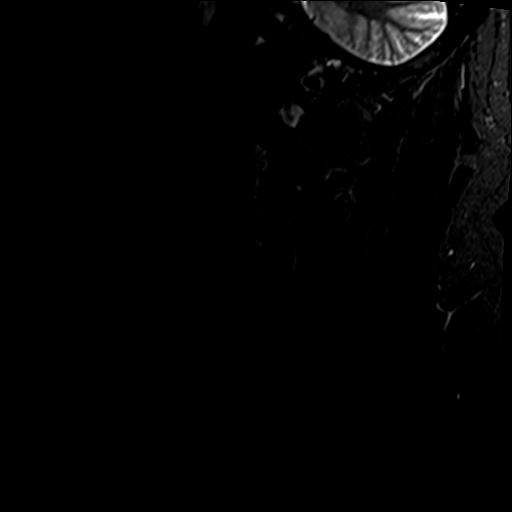
[im 8/13]
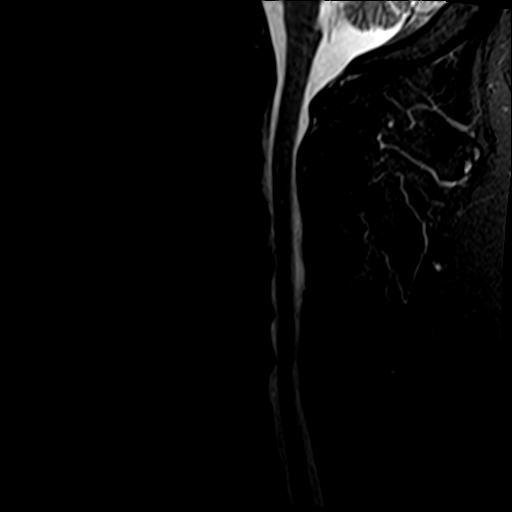
[im 13/13]
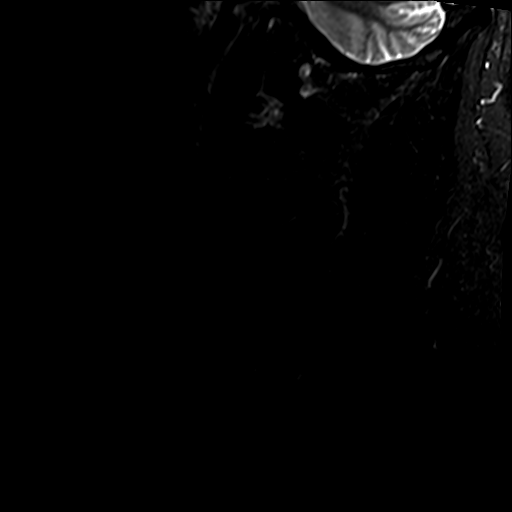

[Series 7: T2 · axial · 3.0mm · 0.70mm/px · z∈[-48,+68]mm · 9 of 31 slices shown]
[im 1/31]
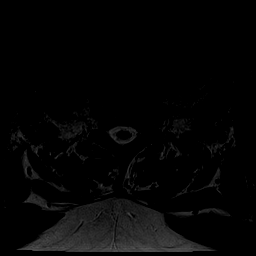
[im 5/31]
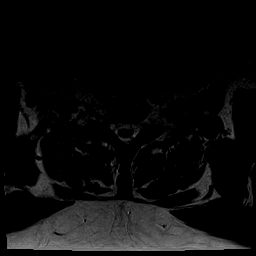
[im 9/31]
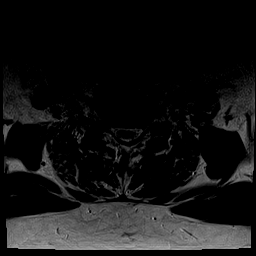
[im 13/31]
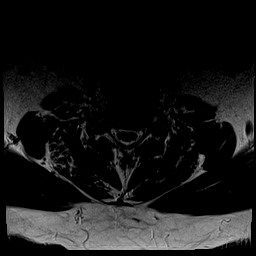
[im 16/31]
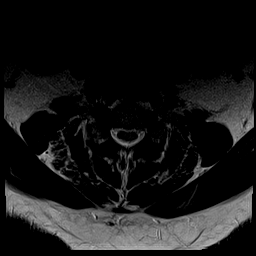
[im 18/31]
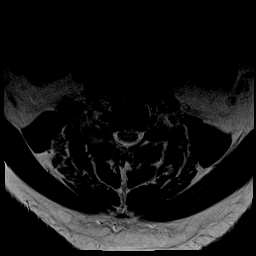
[im 22/31]
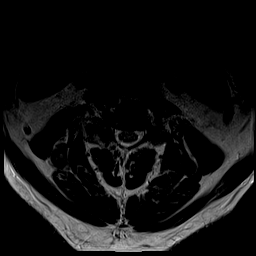
[im 26/31]
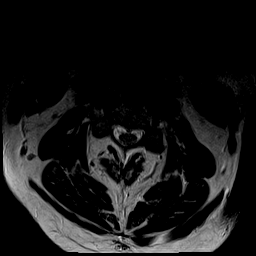
[im 31/31]
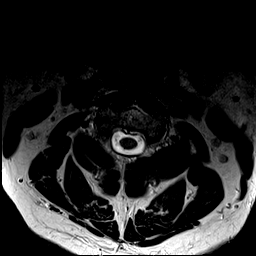

[25 of 48 positions shown; findings below may reference images not displayed]

FINDINGS: Alignment: Straightening of the normal cervical lordosis.

Vertebrae: Normal

Cord: No cord compression or primary cord lesion.

Posterior Fossa, vertebral arteries, paraspinal tissues: Negative

Disc levels:

Foramen magnum is widely patent.  C1-2 and C2-3 are normal.

C3-4: Mild bulging of the disc and uncovertebral prominence.
Bilateral facet osteoarthritis right worse than left. No central
canal stenosis. Bilateral foraminal narrowing that could affect
either C4 nerve.

C4-5: Bilateral uncovertebral prominence. Facet osteoarthritis right
worse than left. Some protruding disc material in the right
posterolateral direction. Bilateral foraminal stenosis right worse
than left. Either C5 nerve root could be affected.

C5-6: Spondylosis with shallow protrusion of the disc. No
compressive canal stenosis. Bilateral foraminal encroachment by
uncovertebral osteophytes and bulging disc material could affect
either or both C6 nerves.

C6-7: Endplate osteophytes and bulging of the disc. Narrowing of the
ventral subarachnoid space but no compression of the cord. Bilateral
foraminal encroachment by uncovertebral osteophytes and bulging disc
material could affect either C7 nerve.

C7-T1: Normal interspace.
IMPRESSION: Degenerative spondylosis from C3-4 through C6-7. No compressive
central canal stenosis. Bilateral foraminal stenosis throughout that
region due to uncovertebral osteophytes, bulging disc material and
some facet osteoarthritis. There is potential that either or both
C4, C5, C6 and or C7 nerves could be affected by the foraminal
disease.
